# Patient Record
Sex: Female | Born: 1955 | Race: White | Hispanic: No | Marital: Single | Smoking: Former smoker
Health system: Southern US, Community
[De-identification: ages and names within clinical notes are randomized; demographics above are authoritative.]

## PROBLEM LIST (undated history)

## (undated) DIAGNOSIS — I10 Essential (primary) hypertension: Secondary | ICD-10-CM

## (undated) HISTORY — PX: TONSILLECTOMY: SUR1361

## (undated) HISTORY — PX: ABDOMINAL HYSTERECTOMY: SHX81

## (undated) HISTORY — PX: CHOLECYSTECTOMY: SHX55

## (undated) HISTORY — PX: APPENDECTOMY: SHX54

---

## 2004-04-06 ENCOUNTER — Ambulatory Visit: Payer: Self-pay | Admitting: Family Medicine

## 2005-01-18 ENCOUNTER — Other Ambulatory Visit: Payer: Self-pay

## 2005-01-31 ENCOUNTER — Ambulatory Visit: Payer: Self-pay | Admitting: Unknown Physician Specialty

## 2005-02-23 ENCOUNTER — Emergency Department: Payer: Self-pay | Admitting: Emergency Medicine

## 2005-04-11 ENCOUNTER — Ambulatory Visit: Payer: Self-pay | Admitting: Family Medicine

## 2006-05-09 ENCOUNTER — Ambulatory Visit: Payer: Self-pay | Admitting: Family Medicine

## 2007-05-10 ENCOUNTER — Ambulatory Visit: Payer: Self-pay

## 2012-08-16 ENCOUNTER — Emergency Department (HOSPITAL_COMMUNITY)
Admission: EM | Admit: 2012-08-16 | Discharge: 2012-08-16 | Disposition: A | Discharge status: Home / Self Care | Payer: Self-pay | Source: Home / Self Care

## 2012-08-16 ENCOUNTER — Encounter (HOSPITAL_COMMUNITY): Payer: Self-pay

## 2012-08-16 DIAGNOSIS — H109 Unspecified conjunctivitis: Secondary | ICD-10-CM

## 2012-08-16 DIAGNOSIS — I10 Essential (primary) hypertension: Secondary | ICD-10-CM

## 2012-08-16 HISTORY — DX: Essential (primary) hypertension: I10

## 2012-08-16 MED ORDER — LISINOPRIL-HYDROCHLOROTHIAZIDE 20-25 MG PO TABS: 1.0000 | ORAL_TABLET | Freq: Every day | ORAL | Status: DC

## 2012-08-16 MED ORDER — ERYTHROMYCIN 5 MG/GM OP OINT: TOPICAL_OINTMENT | Freq: Four times a day (QID) | OPHTHALMIC | Status: DC

## 2012-08-16 NOTE — ED Provider Notes (Signed)
Patient Demographics  Natalie Cuevas, is a 57 y.o. female  ZOX:[id number]  [id number]:[id number]  DOB - March 22, 1956  Chief Complaint  Patient presents with  . Hypertension        Subjective:   Natalie Cuevas is a 57 year old with history of hypertension here today for refill of her medications and also with complaints of left eye redness and "grittiness" in that eye. She admits to a clear discharge,denies itching. Also today has, No headache, No chest pain, No abdominal pain - No Nausea, No new weakness tingling or numbness, No Cough - SOB.   Objective:    Filed Vitals:   08/16/12 1231  BP: 120/85  Pulse: 55  Temp: 97.9 F (36.6 C)  TempSrc: Oral  SpO2: 99%     Exam  Awake Alert, Oriented X 3, No new F.N deficits, Normal affect [place].AT,PERRLA,erythema of left conjunctiva, no discharge present Supple Neck,No JVD, No cervical lymphadenopathy appriciated.  Symmetrical Chest wall movement, Good air movement bilaterally, CTAB RRR,No Gallops,Rubs or new Murmurs, No Parasternal Heave +ve B.Sounds, Abd Soft, Non tender, No organomegaly appreciated, No rebound - guarding or rigidity. No Cyanosis, Clubbing or edema, No new Rash or bruise      Data Review   CBC No results found for this basename: WBC, HGB, HCT, PLT, MCV, MCH, MCHC, RDW, NEUTRABS, LYMPHSABS, MONOABS, EOSABS, BASOSABS, BANDABS, BANDSABD,  in the last 168 hours  Chemistries   No results found for this basename: NA, K, CL, CO2, GLUCOSE, BUN, CREATININE, GFRCGP, CALCIUM, MG, AST, ALT, ALKPHOS, BILITOT,  in the last 168 hours ------------------------------------------------------------------------------------------------------------------ No results found for this basename: HGBA1C,  in the last 72 hours ------------------------------------------------------------------------------------------------------------------ No results found for this basename: CHOL, HDL, LDLCALC, TRIG, CHOLHDL, LDLDIRECT,  in the last 72  hours ------------------------------------------------------------------------------------------------------------------ No results found for this basename: TSH, T4TOTAL, FREET3, T3FREE, THYROIDAB,  in the last 72 hours ------------------------------------------------------------------------------------------------------------------ No results found for this basename: VITAMINB12, FOLATE, FERRITIN, TIBC, IRON, RETICCTPCT,  in the last 72 hours  Coagulation profile  No results found for this basename: INR, PROTIME,  in the last 168 hours     Prior to Admission medications   Medication Sig Start Date End Date Taking? Authorizing Provider  erythromycin [facility]) ophthalmic ointment Place into the left eye every 6 (six) hours. 08/16/12   Kela Millin, MD  lisinopril-hydrochlorothiazide (PRINZIDE,ZESTORETIC) 20-25 MG per tablet Take 1 tablet by mouth daily. 08/16/12   Kela Millin, MD     Assessment & Plan   Left eye conjunctivitis -more likely viral but cannot exclude bacterial etiology -we'll place on erythromycin ointment Hypertension -Good control, continue Prinzide -medications refilled -followup in 3 months     Follow-up Information   Follow up In 1 month. (or as needed)        Micajah Dennin C M.D on 08/16/2012 at 1:26 PM   Kela Millin, MD 08/16/12 [phone]

## 2012-08-16 NOTE — ED Notes (Signed)
Patient has history of HTN Needs medication refills

## 2014-12-01 ENCOUNTER — Ambulatory Visit: Payer: Self-pay | Attending: Family Medicine | Admitting: Family Medicine

## 2014-12-01 ENCOUNTER — Encounter: Payer: Self-pay | Admitting: Family Medicine

## 2014-12-01 VITALS — BP 178/122 | HR 79 | Temp 98.1°F | Resp 18 | Ht 66.0 in | Wt 249.0 lb

## 2014-12-01 DIAGNOSIS — Z720 Tobacco use: Secondary | ICD-10-CM | POA: Insufficient documentation

## 2014-12-01 DIAGNOSIS — K047 Periapical abscess without sinus: Secondary | ICD-10-CM | POA: Insufficient documentation

## 2014-12-01 DIAGNOSIS — I10 Essential (primary) hypertension: Secondary | ICD-10-CM | POA: Insufficient documentation

## 2014-12-01 HISTORY — DX: Periapical abscess without sinus: K04.7

## 2014-12-01 MED ORDER — LISINOPRIL-HYDROCHLOROTHIAZIDE 20-25 MG PO TABS: 1.0000 | ORAL_TABLET | Freq: Every day | ORAL | Status: DC

## 2014-12-01 NOTE — Patient Instructions (Signed)

## 2014-12-01 NOTE — Progress Notes (Signed)
Subjective:    Patient ID: Natalie Cuevas, female    DOB: [birth year], 59 y.o.   MRN: [id number]  HPI  Fateema Motola is a 59 year old female with a history of hypertension who has not been on antihypertensives due to the fact that she lost her insurance and has not been followed by primary care physician in years. She recently presented to the urgent care with tooth ache and was found to have a tooth abscess of the left upper canine tooth and was also found to have an elevated blood pressure with systolic in the 170s. She was placed on amoxicillin which she is currently taking and is planning to attend the free dental clinic for tooth extraction.   Past Medical History  Diagnosis Date  . Hypertension     Past Surgical History  Procedure Laterality Date  . Cholecystectomy    . Appendectomy    . Tonsillectomy     History   Social History  . Marital Status: Single    Spouse Name: N/A  . Number of Children: N/A  . Years of Education: N/A   Occupational History  . Not on file.   Social History Main Topics  . Smoking status: Current Every Day Smoker -- 0.25 packs/day  . Smokeless tobacco: Not on file  . Alcohol Use: No  . Drug Use: No  . Sexual Activity: Not on file   Other Topics Concern  . Not on file   Social History Narrative    Family History  Problem Relation Age of Onset  . Hypertension Mother     Allergies  Allergen Reactions  . Codeine      Review of Systems  General: negative for fever, weight loss, appetite change Eyes: no visual symptoms. ENT: no ear symptoms, no sinus tenderness, no nasal congestion or sore throat, tooth ache Neck: no pain  Respiratory: no wheezing, shortness of breath, cough Cardiovascular: no chest pain, no dyspnea on exertion, no pedal edema, no orthopnea. Gastrointestinal: no abdominal pain, no diarrhea, no constipation Genito-Urinary: no urinary frequency, no dysuria, no polyuria. Hematologic: no bruising Endocrine:  no cold or heat intolerance Neurological: no headaches, no seizures, no tremors Musculoskeletal: no joint pains, no joint swelling Skin: no pruritus, no rash. Psychological: no depression, no anxiety,       Objective: Filed Vitals:   12/01/14 0914 12/01/14 0938  BP: 176/114 178/122  Pulse: 79   Temp: 98.1 F (36.7 C)   TempSrc: Oral   Resp: 18   Height: 5\' 6"  (1.676 m)   Weight: 249 lb (112.946 kg)   SpO2: 95%       Physical Exam  Constitutional: obese,  Eyes: PERRLA HEENT: Head is atraumatic, normal sinuses, poor oral dentition with loss of multiple teeth, normal appearing tonsils and palate, tympanic membrane is normal bilaterally. Neck: normal range of motion, no thyromegaly, no JVD Cardiovascular: normal rate and rhythm, normal heart sounds, no murmurs, rub or gallop, no pedal edema Respiratory: clear to auscultation bilaterally, no wheezes, no rales, no rhonchi Abdomen: soft, not tender to palpation, normal bowel sounds, no enlarged organs Extremities: Full ROM, no tenderness in joints Skin: warm and dry, no lesions. Neurological: alert, oriented x3, cranial nerves I-XII grossly intact , normal motor strength, normal sensation. Psychological: normal mood.        Assessment & Plan:  59 year old female with a history of hypertension not on any antihypertensives and currently on amoxicillin for tooth abscess who is here to establish care.  Uncontrolled hypertension: Commenced on lisinopril/HCTZ- side effects discussed. Will recheck blood pressure next office visit meanwhile lifestyle modifications advised including low-sodium diet, increasing physical activity, reducing portion sizes. Fasting labs at next office visit.  Tooth abscess: Currently on antibiotics.  Tobacco abuse: Smoking cessation support: smoking cessation hotline: 1-800-QUIT-NOW.  Smoking cessation classes are available through [facility]. Call [phone] or visit our  website at [email or web address].  Spent 3 minutes counseling on smoking cessation and patient is not ready to quit.   This note has been created with Education officer, environmental. Any transcriptional errors are unintentional.

## 2014-12-01 NOTE — Progress Notes (Signed)
Patient here for ED follow up for tooth abscess and HTN.  Patient states she feels "alright" today.  Patient has no complaints of pain at this time.  Patient needs BP medication.  BP 176/114, HR 79.  Patient not ready to quit smoking. Patient taking Amoxicillin for tooth abscess, will finish taking this week.

## 2014-12-19 ENCOUNTER — Ambulatory Visit: Payer: Self-pay | Attending: Family Medicine | Admitting: Family Medicine

## 2014-12-19 ENCOUNTER — Encounter: Payer: Self-pay | Admitting: Family Medicine

## 2014-12-19 VITALS — BP 123/90 | HR 98 | Temp 98.3°F | Resp 18 | Ht 66.0 in | Wt 243.8 lb

## 2014-12-19 DIAGNOSIS — Z79899 Other long term (current) drug therapy: Secondary | ICD-10-CM | POA: Insufficient documentation

## 2014-12-19 DIAGNOSIS — E669 Obesity, unspecified: Secondary | ICD-10-CM | POA: Insufficient documentation

## 2014-12-19 DIAGNOSIS — I10 Essential (primary) hypertension: Secondary | ICD-10-CM | POA: Insufficient documentation

## 2014-12-19 DIAGNOSIS — F17210 Nicotine dependence, cigarettes, uncomplicated: Secondary | ICD-10-CM | POA: Insufficient documentation

## 2014-12-19 LAB — BASIC METABOLIC PANEL
BUN: 18 mg/dL (ref 6–23)
CHLORIDE: 98 meq/L (ref 96–112)
CO2: 30 mEq/L (ref 19–32)
Calcium: 9.5 mg/dL (ref 8.4–10.5)
Creat: 0.86 mg/dL (ref 0.50–1.10)
GLUCOSE: 96 mg/dL (ref 70–99)
POTASSIUM: 4.3 meq/L (ref 3.5–5.3)
SODIUM: 140 meq/L (ref 135–145)

## 2014-12-19 NOTE — Progress Notes (Signed)
Subjective:    Patient ID: Natalie Cuevas, female    DOB: 26-Apr-1956, 59 y.o.   MRN: [id number]  HPI  Britt BottomCarolyn Cuevas is a 59 year old female with a history of newly diagnosed hypertension who was commenced on lisinopril/HCTZ at her last office visit and comes in today for follow-up of her blood pressure.  She endorses compliance with her medication and has no complaints today. Denies chest pains, shortness of breath, pedal edema.   Past Medical History  Diagnosis Date  . Hypertension     Past Surgical History  Procedure Laterality Date  . Cholecystectomy    . Appendectomy    . Tonsillectomy      Family History  Problem Relation Age of Onset  . Hypertension Mother     History   Social History  . Marital Status: Single    Spouse Name: N/A  . Number of Children: N/A  . Years of Education: N/A   Occupational History  . Not on file.   Social History Main Topics  . Smoking status: Current Every Day Smoker -- 0.25 packs/day  . Smokeless tobacco: Not on file  . Alcohol Use: No  . Drug Use: No  . Sexual Activity: Not on file   Other Topics Concern  . Not on file   Social History Narrative    Allergies  Allergen Reactions  . Codeine     Current Outpatient Prescriptions on File Prior to Visit  Medication Sig Dispense Refill  . lisinopril-hydrochlorothiazide (PRINZIDE,ZESTORETIC) 20-25 MG per tablet Take 1 tablet by mouth daily. 30 tablet 2  . erythromycin (ROMYCIN) ophthalmic ointment Place into the left eye every 6 (six) hours. (Patient not taking: Reported on 12/01/2014) 3.5 g 0   No current facility-administered medications on file prior to visit.     Review of Systems  General: negative for fever, weight loss, appetite change Eyes: no visual symptoms. ENT: no ear symptoms, no sinus tenderness, no nasal congestion or sore throat. Neck: no pain  Respiratory: no wheezing, shortness of breath, cough Cardiovascular: no chest pain, no dyspnea on  exertion, no pedal edema, no orthopnea. Gastrointestinal: no abdominal pain, no diarrhea, no constipation Genito-Urinary: no urinary frequency, no dysuria, no polyuria. Hematologic: no bruising Endocrine: no cold or heat intolerance Neurological: no headaches, no seizures, no tremors Musculoskeletal: no joint pains, no joint swelling Skin: no pruritus, no rash. Psychological: no depression, no anxiety,       Objective: Filed Vitals:   12/19/14 1532  BP: 123/90  Pulse: 98  Temp: 98.3 F (36.8 C)  TempSrc: Oral  Resp: 18  Height: 5\' 6"  (1.676 m)  Weight: 243 lb 12.8 oz (110.587 kg)  SpO2: 95%      Physical Exam  Constitutional: obese Neck: normal range of motion, no thyromegaly, no JVD Cardiovascular: normal rate and rhythm, normal heart sounds, no murmurs, rub or gallop, no pedal edema Respiratory: clear to auscultation bilaterally, no wheezes, no rales, no rhonchi Abdomen: soft, not tender to palpation, normal bowel sounds, no enlarged organs Extremities: Full ROM, no tenderness in joints Skin: warm and dry, no lesions. Neurological: alert, oriented x3, cranial nerves I-XII grossly intact , normal motor strength, normal sensation. Psychological: normal mood.         Assessment & Plan:   59 year old obese, female patient with hypertension currently controlled on lisinopril/HCTZ who is in today for follow-up visit.  Hypertension: Controlled. Continue lisinopril/HCTZ, low-sodium, DASH each diet. I will send off a basic metabolic panel to check renal  function.  Obesity: Advised on active lifestyle management Reduce portion sizes, increase physical activity.  This note has been created with Education officer, environmental. Any transcriptional errors are unintentional.

## 2014-12-19 NOTE — Progress Notes (Signed)
Patient here for follow up for HTN. Patient reports she feels fine today. Patient denies pain today but reports soreness of upper lip area when touched due to an abscessed tooth. Patient has taken her BP medication today.  BP 123/90

## 2014-12-19 NOTE — Patient Instructions (Signed)

## 2014-12-23 ENCOUNTER — Telehealth: Payer: Self-pay | Admitting: *Deleted

## 2014-12-23 NOTE — Telephone Encounter (Signed)
-----   Message from Jaclyn ShaggyEnobong Amao, MD sent at 12/20/2014  8:14 AM EDT ----- Please inform the patient that labs are normal. Thank you.

## 2014-12-23 NOTE — Telephone Encounter (Signed)
Verified patient's name and date of birth and gave her normal lab results.  Patient had a question regarding her orange card application [facility]

## 2014-12-26 ENCOUNTER — Ambulatory Visit: Payer: Self-pay | Attending: Internal Medicine

## 2014-12-26 ENCOUNTER — Ambulatory Visit: Payer: Self-pay

## 2015-01-15 ENCOUNTER — Encounter: Payer: Self-pay | Admitting: Internal Medicine

## 2015-01-15 ENCOUNTER — Ambulatory Visit: Payer: Self-pay | Attending: Internal Medicine | Admitting: Internal Medicine

## 2015-01-15 VITALS — BP 121/83 | HR 84 | Temp 98.0°F | Resp 18 | Ht 66.0 in | Wt 242.0 lb

## 2015-01-15 DIAGNOSIS — Z720 Tobacco use: Secondary | ICD-10-CM

## 2015-01-15 DIAGNOSIS — F17200 Nicotine dependence, unspecified, uncomplicated: Secondary | ICD-10-CM | POA: Insufficient documentation

## 2015-01-15 DIAGNOSIS — F17210 Nicotine dependence, cigarettes, uncomplicated: Secondary | ICD-10-CM | POA: Insufficient documentation

## 2015-01-15 DIAGNOSIS — Z79899 Other long term (current) drug therapy: Secondary | ICD-10-CM | POA: Insufficient documentation

## 2015-01-15 DIAGNOSIS — I10 Essential (primary) hypertension: Secondary | ICD-10-CM

## 2015-01-15 DIAGNOSIS — E669 Obesity, unspecified: Secondary | ICD-10-CM

## 2015-01-15 LAB — CBC
HEMATOCRIT: 43.8 % (ref 36.0–46.0)
HEMOGLOBIN: 14.6 g/dL (ref 12.0–15.0)
MCH: 28.6 pg (ref 26.0–34.0)
MCHC: 33.3 g/dL (ref 30.0–36.0)
MCV: 85.9 fL (ref 78.0–100.0)
MPV: 10.1 fL (ref 8.6–12.4)
PLATELETS: 284 10*3/uL (ref 150–400)
RBC: 5.1 MIL/uL (ref 3.87–5.11)
RDW: 14.4 % (ref 11.5–15.5)
WBC: 9.4 10*3/uL (ref 4.0–10.5)

## 2015-01-15 MED ORDER — LISINOPRIL-HYDROCHLOROTHIAZIDE 20-25 MG PO TABS: 1.0000 | ORAL_TABLET | Freq: Every day | ORAL | Status: DC

## 2015-01-15 NOTE — Patient Instructions (Signed)
Exercise to Lose Weight Exercise and a healthy diet may help you lose weight. Your doctor may suggest specific exercises. EXERCISE IDEAS AND TIPS  Choose low-cost things you enjoy doing, such as walking, bicycling, or exercising to workout videos.  Take stairs instead of the elevator.  Walk during your lunch break.  Park your car further away from work or school.  Go to a gym or an exercise class.  Start with 5 to 10 minutes of exercise each day. Build up to 30 minutes of exercise 4 to 6 days a week.  Wear shoes with good support and comfortable clothes.  Stretch before and after working out.  Work out until you breathe harder and your heart beats faster.  Drink extra water when you exercise.  Do not do so much that you hurt yourself, feel dizzy, or get very short of breath. Exercises that burn about 150 calories:  Running 1  miles in 15 minutes.  Playing volleyball for 45 to 60 minutes.  Washing and waxing a car for 45 to 60 minutes.  Playing touch football for 45 minutes.  Walking 1  miles in 35 minutes.  Pushing a stroller 1  miles in 30 minutes.  Playing basketball for 30 minutes.  Raking leaves for 30 minutes.  Bicycling 5 miles in 30 minutes.  Walking 2 miles in 30 minutes.  Dancing for 30 minutes.  Shoveling snow for 15 minutes.  Swimming laps for 20 minutes.  Walking up stairs for 15 minutes.  Bicycling 4 miles in 15 minutes.  Gardening for 30 to 45 minutes.  Jumping rope for 15 minutes.  Washing windows or floors for 45 to 60 minutes. Document Released: 07/09/2010 Document Revised: 08/29/2011 Document Reviewed: 07/09/2010 ExitCare Patient Information 2015 [employer]. This information is not intended to replace advice given to you by your health care provider. Make sure you discuss any questions you have with your health care provider.   Smoking Cessation Quitting smoking is important to your health and has many advantages. However,  it is not always easy to quit since nicotine is a very addictive drug. Oftentimes, people try 3 times or more before being able to quit. This document explains the best ways for you to prepare to quit smoking. Quitting takes hard work and a lot of effort, but you can do it. ADVANTAGES OF QUITTING SMOKING  You will live longer, feel better, and live better.  Your body will feel the impact of quitting smoking almost immediately.  Within 20 minutes, blood pressure decreases. Your pulse returns to its normal level.  After 8 hours, carbon monoxide levels in the blood return to normal. Your oxygen level increases.  After 24 hours, the chance of having a heart attack starts to decrease. Your breath, hair, and body stop smelling like smoke.  After 48 hours, damaged nerve endings begin to recover. Your sense of taste and smell improve.  After 72 hours, the body is virtually free of nicotine. Your bronchial tubes relax and breathing becomes easier.  After 2 to 12 weeks, lungs can hold more air. Exercise becomes easier and circulation improves.  The risk of having a heart attack, stroke, cancer, or lung disease is greatly reduced.  After 1 year, the risk of coronary heart disease is cut in half.  After 5 years, the risk of stroke falls to the same as a nonsmoker.  After 10 years, the risk of lung cancer is cut in half and the risk of other cancers decreases significantly.    After 15 years, the risk of coronary heart disease drops, usually to the level of a nonsmoker.  If you are pregnant, quitting smoking will improve your chances of having a healthy baby.  The people you live with, especially any children, will be healthier.  You will have extra money to spend on things other than cigarettes. QUESTIONS TO THINK ABOUT BEFORE ATTEMPTING TO QUIT You may want to talk about your answers with your health care provider.  Why do you want to quit?  If you tried to quit in the past, what helped and  what did not?  What will be the most difficult situations for you after you quit? How will you plan to handle them?  Who can help you through the tough times? Your family? Friends? A health care provider?  What pleasures do you get from smoking? What ways can you still get pleasure if you quit? Here are some questions to ask your health care provider:  How can you help me to be successful at quitting?  What medicine do you think would be best for me and how should I take it?  What should I do if I need more help?  What is smoking withdrawal like? How can I get information on withdrawal? GET READY  Set a quit date.  Change your environment by getting rid of all cigarettes, ashtrays, matches, and lighters in your home, car, or work. Do not let people smoke in your home.  Review your past attempts to quit. Think about what worked and what did not. GET SUPPORT AND ENCOURAGEMENT You have a better chance of being successful if you have help. You can get support in many ways.  Tell your family, friends, and coworkers that you are going to quit and need their support. Ask them not to smoke around you.  Get individual, group, or telephone counseling and support. Programs are available at local hospitals and health centers. Call your local health department for information about programs in your area.  Spiritual beliefs and practices may help some smokers quit.  Download a "quit meter" on your computer to keep track of quit statistics, such as how long you have gone without smoking, cigarettes not smoked, and money saved.  Get a self-help book about quitting smoking and staying off tobacco. LEARN NEW SKILLS AND BEHAVIORS  Distract yourself from urges to smoke. Talk to someone, go for a walk, or occupy your time with a task.  Change your normal routine. Take a different route to work. Drink tea instead of coffee. Eat breakfast in a different place.  Reduce your stress. Take a hot bath,  exercise, or read a book.  Plan something enjoyable to do every day. Reward yourself for not smoking.  Explore interactive web-based programs that specialize in helping you quit. GET MEDICINE AND USE IT CORRECTLY Medicines can help you stop smoking and decrease the urge to smoke. Combining medicine with the above behavioral methods and support can greatly increase your chances of successfully quitting smoking.  Nicotine replacement therapy helps deliver nicotine to your body without the negative effects and risks of smoking. Nicotine replacement therapy includes nicotine gum, lozenges, inhalers, nasal sprays, and skin patches. Some may be available over-the-counter and others require a prescription.  Antidepressant medicine helps people abstain from smoking, but how this works is unknown. This medicine is available by prescription.  Nicotinic receptor partial agonist medicine simulates the effect of nicotine in your brain. This medicine is available by prescription. Ask your   health care provider for advice about which medicines to use and how to use them based on your health history. Your health care provider will tell you what side effects to look out for if you choose to be on a medicine or therapy. Carefully read the information on the package. Do not use any other product containing nicotine while using a nicotine replacement product.  RELAPSE OR DIFFICULT SITUATIONS Most relapses occur within the first 3 months after quitting. Do not be discouraged if you start smoking again. Remember, most people try several times before finally quitting. You may have symptoms of withdrawal because your body is used to nicotine. You may crave cigarettes, be irritable, feel very hungry, cough often, get headaches, or have difficulty concentrating. The withdrawal symptoms are only temporary. They are strongest when you first quit, but they will go away within 10-14 days. To reduce the chances of relapse, try  to:  Avoid drinking alcohol. Drinking lowers your chances of successfully quitting.  Reduce the amount of caffeine you consume. Once you quit smoking, the amount of caffeine in your body increases and can give you symptoms, such as a rapid heartbeat, sweating, and anxiety.  Avoid smokers because they can make you want to smoke.  Do not let weight gain distract you. Many smokers will gain weight when they quit, usually less than 10 pounds. Eat a healthy diet and stay active. You can always lose the weight gained after you quit.  Find ways to improve your mood other than smoking. FOR MORE INFORMATION  www.smokefree.gov  Document Released: 05/31/2001 Document Revised: 10/21/2013 Document Reviewed: 09/15/2011 ExitCare Patient Information 2015 [employer]. This information is not intended to replace advice given to you by your health care provider. Make sure you discuss any questions you have with your health care provider.  

## 2015-01-15 NOTE — Progress Notes (Signed)
Patient here to establish care for HTN and for fasting labs. Patient is fasting.   Last had BP med this morning.

## 2015-01-15 NOTE — Progress Notes (Signed)
Patient ID: Natalie Cuevas, female   DOB: 10-Aug-1955, 59 y.o.   MRN: [id number]  CC: HTN  HPI: Natalie Cuevas is a 59 y.o. female here today for a follow up visit.  Patient has past medical history of hypertension and tobacco use. She smokes 4 cigarettes per day and is not ready to quit smoking. She has been on Lisinopril-HCTZ for 2 months with no complications. She denies headache, chest pain, SOB, blurred vision, dizziness, edema. Patient is not up to date on pap or mammogram and is currently refusing to have one.    Allergies  Allergen Reactions  . Codeine    Past Medical History  Diagnosis Date  . Hypertension    Current Outpatient Prescriptions on File Prior to Visit  Medication Sig Dispense Refill  . lisinopril-hydrochlorothiazide (PRINZIDE,ZESTORETIC) 20-25 MG per tablet Take 1 tablet by mouth daily. 30 tablet 2  . erythromycin (ROMYCIN) ophthalmic ointment Place into the left eye every 6 (six) hours. (Patient not taking: Reported on 12/01/2014) 3.5 g 0   No current facility-administered medications on file prior to visit.   Family History  Problem Relation Age of Onset  . Hypertension Mother    History   Social History  . Marital Status: Single    Spouse Name: N/A  . Number of Children: N/A  . Years of Education: N/A   Occupational History  . Not on file.   Social History Main Topics  . Smoking status: Current Every Day Smoker -- 0.25 packs/day  . Smokeless tobacco: Not on file  . Alcohol Use: No  . Drug Use: No  . Sexual Activity: Not on file   Other Topics Concern  . Not on file   Social History Narrative    Review of Systems: See HPI   Objective:   Filed Vitals:   01/15/15 0911  BP: 121/83  Pulse: 84  Temp: 98 F (36.7 C)  Resp: 18    Physical Exam  Constitutional: She is oriented to person, place, and time.  Cardiovascular: Normal rate, regular rhythm and normal heart sounds.   Pulmonary/Chest: Effort normal and breath sounds normal.   Musculoskeletal: She exhibits no edema.  Neurological: She is alert and oriented to person, place, and time.  Psychiatric: She has a normal mood and affect.     No results found for: WBC, HGB, HCT, MCV, PLT Lab Results  Component Value Date   CREATININE 0.86 12/19/2014   BUN 18 12/19/2014   NA 140 12/19/2014   K 4.3 12/19/2014   CL 98 12/19/2014   CO2 30 12/19/2014    No results found for: HGBA1C Lipid Panel  No results found for: CHOL, TRIG, HDL, CHOLHDL, VLDL, LDLCALC     Assessment and plan:   [name] was seen today for establish care.  Diagnoses and all orders for this visit:  Essential hypertension Orders: -     Lipid panel -     CBC -     TSH -     Hemoglobin A1C -     Vitamin D, 25-hydroxy -     lisinopril-hydrochlorothiazide (PRINZIDE,ZESTORETIC) 20-25 MG per tablet; Take 1 tablet by mouth daily. Patient blood pressure is stable and may continue on current medication.  Education on diet, exercise, and modifiable risk factors discussed. Will obtain appropriate labs as needed. Will follow up in 3-6 months.   Obesity Weight loss discussed at length and its complications to health.  Patient will loss 10 months by next visit in 3 months.  Diet and exercise discussed as well as calorie intake.  Tobacco use disorder Smoking cessation discussed for 3 minutes, patient is not willing to quit at this time. Will continue to assess on each visit. Discussed increased risk for diseases such as cancer, heart disease, and stroke.   Return in about 3 months (around 04/17/2015) for Hypertension.       Ambrose Finland, NP-C [facility] [phone] 01/15/2015, 9:31 AM

## 2015-01-16 ENCOUNTER — Ambulatory Visit: Payer: Self-pay | Admitting: Internal Medicine

## 2015-01-16 LAB — HEMOGLOBIN A1C
Hgb A1c MFr Bld: 6.3 % — ABNORMAL HIGH (ref <5.7)
MEAN PLASMA GLUCOSE: 134 mg/dL — AB (ref <117)

## 2015-01-16 LAB — TSH: TSH: 3.092 u[IU]/mL (ref 0.350–4.500)

## 2015-01-16 LAB — LIPID PANEL
Cholesterol: 170 mg/dL (ref 125–200)
HDL: 39 mg/dL — ABNORMAL LOW (ref >=46)
LDL CALC: 104 mg/dL (ref <130)
TRIGLYCERIDES: 133 mg/dL (ref <150)
Total CHOL/HDL Ratio: 4.4 Ratio (ref <=5.0)
VLDL: 27 mg/dL (ref <30)

## 2015-01-16 LAB — VITAMIN D 25 HYDROXY (VIT D DEFICIENCY, FRACTURES): Vit D, 25-Hydroxy: 24 ng/mL — ABNORMAL LOW (ref 30–100)

## 2015-01-19 ENCOUNTER — Telehealth: Payer: Self-pay

## 2015-01-19 NOTE — Telephone Encounter (Signed)
-----   Message from Ambrose Finland, NP sent at 01/18/2015  8:57 PM EDT ----- Prediabetic. Please educate. Vitamin D is low, please have patient to get OTC vitamin D 800 IU to take daily. All other labs are normal

## 2015-01-19 NOTE — Telephone Encounter (Signed)
Nurse called patient, patient verified date of birth. Patient aware of being prediabetic. Nurse educated patient on limiting carbohydrates and sugar. Nurse explained to patient that potatoes, rice, pasta and bread are carbohydrates and carbohydrates turn to sugar and will increase glucose levels. Patient aware of importance of adding exercise, as tolerated, at least 3 times per week, 30 minutes each time.  Patient aware of low Vit D and agrees to get OTC vit D 800 IU to take daily.  Patient aware of all other labs are normal. Patient voices understanding and has no further questions at this time.

## 2015-04-07 ENCOUNTER — Ambulatory Visit: Payer: Self-pay | Admitting: Internal Medicine

## 2015-06-26 MED FILL — LISINOPRIL-HCTZ 20-25 MG TA: 20-25 | 30 days supply | Qty: 30 | Fill #4

## 2015-07-29 MED FILL — LISINOPRIL-HCTZ 20-25 MG TA: 20-25 | 30 days supply | Qty: 30 | Fill #5

## 2015-08-25 ENCOUNTER — Other Ambulatory Visit: Payer: Self-pay | Admitting: Internal Medicine

## 2015-08-25 MED FILL — LISINOPRIL-HCTZ 20-25 MG TA: 20-25 | 30 days supply | Qty: 30 | Fill #0

## 2015-09-07 ENCOUNTER — Ambulatory Visit: Payer: Self-pay | Admitting: Internal Medicine

## 2015-09-08 ENCOUNTER — Ambulatory Visit: Payer: Self-pay | Attending: Internal Medicine | Admitting: Pharmacist

## 2015-09-08 ENCOUNTER — Other Ambulatory Visit: Payer: Self-pay | Admitting: Internal Medicine

## 2015-09-08 ENCOUNTER — Encounter: Payer: Self-pay | Admitting: Pharmacist

## 2015-09-08 VITALS — BP 107/75 | HR 66

## 2015-09-08 DIAGNOSIS — I10 Essential (primary) hypertension: Secondary | ICD-10-CM | POA: Insufficient documentation

## 2015-09-08 DIAGNOSIS — Z79899 Other long term (current) drug therapy: Secondary | ICD-10-CM | POA: Insufficient documentation

## 2015-09-08 MED ORDER — LISINOPRIL-HYDROCHLOROTHIAZIDE 20-25 MG PO TABS: 1.0000 | ORAL_TABLET | Freq: Every day | ORAL | Status: DC

## 2015-09-08 NOTE — Progress Notes (Signed)
S:    Patient arrives in good spirits. Presents to the clinic for hypertension evaluation. She was supposed to see Holland CommonsValerie Keck, NP, yesterday and her appointment was cancelled.   Patient reports adherence with medication. She needs a refill on her medication.  Current BP Medications include:  Lisinopril-HCTZ 20-25 mg daily   O:   Last 3 Office BP readings: BP Readings from Last 3 Encounters:  09/08/15 107/75  01/15/15 121/83  12/19/14 123/90    BMET    Component Value Date/Time   NA 140 12/19/2014 1553   K 4.3 12/19/2014 1553   CL 98 12/19/2014 1553   CO2 30 12/19/2014 1553   GLUCOSE 96 12/19/2014 1553   BUN 18 12/19/2014 1553   CREATININE 0.86 12/19/2014 1553   CALCIUM 9.5 12/19/2014 1553    A/P: Hypertension longstanding currently controlled on current medications.  Continued lisinopril-HCTZ 20-25 mg. Patient needs follow up with Holland CommonsValerie Keck for a routine examination.  Results reviewed and written information provided.   Total time in face-to-face counseling 10 minutes.   F/U Clinic Visit with Holland CommonsValerie Keck.  Patient seen with Theresa MulliganMartin Shaughnessy, PharmD Candidate

## 2015-09-08 NOTE — Patient Instructions (Addendum)
Thanks for coming to see me today!  Blood pressure looks great!  Schedule an appointment to see Holland CommonsValerie Keck for a routine follow up

## 2015-09-29 MED FILL — LISINOPRIL-HCTZ 20-25 MG TA: 20-25 | 30 days supply | Qty: 30 | Fill #0

## 2015-11-04 MED FILL — LISINOPRIL-HCTZ 20-25 MG TA: 20-25 | 30 days supply | Qty: 30 | Fill #1

## 2015-11-30 ENCOUNTER — Ambulatory Visit: Payer: Self-pay | Attending: Family Medicine | Admitting: Family Medicine

## 2015-11-30 ENCOUNTER — Encounter: Payer: Self-pay | Admitting: Family Medicine

## 2015-11-30 VITALS — BP 112/81 | HR 93 | Temp 97.5°F | Ht 66.0 in | Wt 235.4 lb

## 2015-11-30 DIAGNOSIS — Z1159 Encounter for screening for other viral diseases: Secondary | ICD-10-CM

## 2015-11-30 DIAGNOSIS — E1165 Type 2 diabetes mellitus with hyperglycemia: Secondary | ICD-10-CM | POA: Insufficient documentation

## 2015-11-30 DIAGNOSIS — R7303 Prediabetes: Secondary | ICD-10-CM

## 2015-11-30 DIAGNOSIS — I10 Essential (primary) hypertension: Secondary | ICD-10-CM

## 2015-11-30 LAB — POCT GLYCOSYLATED HEMOGLOBIN (HGB A1C): Hemoglobin A1C: 5.7

## 2015-11-30 MED ORDER — LISINOPRIL-HYDROCHLOROTHIAZIDE 20-25 MG PO TABS: 1.0000 | ORAL_TABLET | Freq: Every day | ORAL | Status: DC

## 2015-11-30 MED FILL — LISINOPRIL-HCTZ 20-25 MG TA: 20-25 | 30 days supply | Qty: 30 | Fill #0

## 2015-11-30 NOTE — Progress Notes (Signed)
   Subjective:  Patient ID: Natalie Cuevas, female    DOB: 01-04-1956  Age: 60 y.o. MRN: [id number]  CC: HTN   HPI Natalie Cuevas is a 60 year old female with a history of hypertension, prediabetes who comes into the clinic to establish care with me. She was previously followed by the nurse practitioner who is no longer with the practice.  Has been compliant with her antihypertensive but not with low-sodium diet and she relies on food stamps; she has also not been compliant with an exercise regimen. She quit smoking a few months ago.  No complaints at this time.    Outpatient Prescriptions Prior to Visit  Medication Sig Dispense Refill  . lisinopril-hydrochlorothiazide (PRINZIDE,ZESTORETIC) 20-25 MG tablet Take 1 tablet by mouth daily. 30 tablet 2   No facility-administered medications prior to visit.    ROS Review of Systems  Constitutional: Negative for activity change, appetite change and fatigue.  HENT: Negative for congestion, sinus pressure and sore throat.   Eyes: Negative for visual disturbance.  Respiratory: Negative for cough, chest tightness, shortness of breath and wheezing.   Cardiovascular: Negative for chest pain and palpitations.  Gastrointestinal: Negative for abdominal pain, constipation and abdominal distention.  Endocrine: Negative for polydipsia.  Genitourinary: Negative for dysuria and frequency.  Musculoskeletal: Negative for back pain and arthralgias.  Skin: Negative for rash.  Neurological: Negative for tremors, light-headedness and numbness.  Hematological: Does not bruise/bleed easily.  Psychiatric/Behavioral: Negative for behavioral problems and agitation.    Objective:  BP 112/81 mmHg  Pulse 93  Temp(Src) 97.5 F (36.4 C)  Ht 5\' 6"  (1.676 m)  Wt 235 lb 6.4 oz (106.777 kg)  BMI 38.01 kg/m2  BP/Weight 11/30/2015 09/08/2015 01/15/2015  Systolic BP 112 107 121  Diastolic BP 81 75 83  Wt. (Lbs) 235.4 - 242  BMI 38.01 - 39.08       Physical Exam  Constitutional: She is oriented to person, place, and time. She appears well-developed and well-nourished.  Cardiovascular: Normal rate, normal heart sounds and intact distal pulses.   No murmur heard. Pulmonary/Chest: Effort normal and breath sounds normal. She has no wheezes. She has no rales. She exhibits no tenderness.  Abdominal: Soft. Bowel sounds are normal. She exhibits no distension and no mass. There is no tenderness.  Musculoskeletal: Normal range of motion.  Neurological: She is alert and oriented to person, place, and time.    Lab Results  Component Value Date   HGBA1C 5.7 11/30/2015    Assessment & Plan:   1. Essential hypertension Controlled - lisinopril-hydrochlorothiazide (PRINZIDE,ZESTORETIC) 20-25 MG tablet; Take 1 tablet by mouth daily.  Dispense: 30 tablet; Refill: 5 - COMPLETE METABOLIC PANEL WITH GFR; Future - Lipid panel; Future  2. Prediabetes A1c 5.7 - HgB A1c  3. Need for hepatitis C screening test - Hepatitis C Antibody; Future   Meds ordered this encounter  Medications  . lisinopril-hydrochlorothiazide (PRINZIDE,ZESTORETIC) 20-25 MG tablet    Sig: Take 1 tablet by mouth daily.    Dispense:  30 tablet    Refill:  5    Follow-up: Return in about 3 weeks (around 12/21/2015) for Complete physical exam.   Jaclyn ShaggyEnobong Amao MD

## 2015-11-30 NOTE — Patient Instructions (Signed)
Hypertension Hypertension, commonly called high blood pressure, is when the force of blood pumping through your arteries is too strong. Your arteries are the blood vessels that carry blood from your heart throughout your body. A blood pressure reading consists of a higher number over a lower number, such as 110/72. The higher number (systolic) is the pressure inside your arteries when your heart pumps. The lower number (diastolic) is the pressure inside your arteries when your heart relaxes. Ideally you want your blood pressure below 120/80. Hypertension forces your heart to work harder to pump blood. Your arteries may become narrow or stiff. Having untreated or uncontrolled hypertension can cause heart attack, stroke, kidney disease, and other problems. RISK FACTORS Some risk factors for high blood pressure are controllable. Others are not.  Risk factors you cannot control include:   Race. You may be at higher risk if you are African American.  Age. Risk increases with age.  Gender. Men are at higher risk than women before age 45 years. After age 65, women are at higher risk than men. Risk factors you can control include:  Not getting enough exercise or physical activity.  Being overweight.  Getting too much fat, sugar, calories, or salt in your diet.  Drinking too much alcohol. SIGNS AND SYMPTOMS Hypertension does not usually cause signs or symptoms. Extremely high blood pressure (hypertensive crisis) may cause headache, anxiety, shortness of breath, and nosebleed. DIAGNOSIS To check if you have hypertension, your health care provider will measure your blood pressure while you are seated, with your arm held at the level of your heart. It should be measured at least twice using the same arm. Certain conditions can cause a difference in blood pressure between your right and left arms. A blood pressure reading that is higher than normal on one occasion does not mean that you need treatment. If  it is not clear whether you have high blood pressure, you may be asked to return on a different day to have your blood pressure checked again. Or, you may be asked to monitor your blood pressure at home for 1 or more weeks. TREATMENT Treating high blood pressure includes making lifestyle changes and possibly taking medicine. Living a healthy lifestyle can help lower high blood pressure. You may need to change some of your habits. Lifestyle changes may include:  Following the DASH diet. This diet is high in fruits, vegetables, and whole grains. It is low in salt, red meat, and added sugars.  Keep your sodium intake below 2,300 mg per day.  Getting at least 30-45 minutes of aerobic exercise at least 4 times per week.  Losing weight if necessary.  Not smoking.  Limiting alcoholic beverages.  Learning ways to reduce stress. Your health care provider may prescribe medicine if lifestyle changes are not enough to get your blood pressure under control, and if one of the following is true:  You are 18-59 years of age and your systolic blood pressure is above 140.  You are 60 years of age or older, and your systolic blood pressure is above 150.  Your diastolic blood pressure is above 90.  You have diabetes, and your systolic blood pressure is over 140 or your diastolic blood pressure is over 90.  You have kidney disease and your blood pressure is above 140/90.  You have heart disease and your blood pressure is above 140/90. Your personal target blood pressure may vary depending on your medical conditions, your age, and other factors. HOME CARE INSTRUCTIONS    Have your blood pressure rechecked as directed by your health care provider.   Take medicines only as directed by your health care provider. Follow the directions carefully. Blood pressure medicines must be taken as prescribed. The medicine does not work as well when you skip doses. Skipping doses also puts you at risk for  problems.  Do not smoke.   Monitor your blood pressure at home as directed by your health care provider. SEEK MEDICAL CARE IF:   You think you are having a reaction to medicines taken.  You have recurrent headaches or feel dizzy.  You have swelling in your ankles.  You have trouble with your vision. SEEK IMMEDIATE MEDICAL CARE IF:  You develop a severe headache or confusion.  You have unusual weakness, numbness, or feel faint.  You have severe chest or abdominal pain.  You vomit repeatedly.  You have trouble breathing. MAKE SURE YOU:   Understand these instructions.  Will watch your condition.  Will get help right away if you are not doing well or get worse.   This information is not intended to replace advice given to you by your health care provider. Make sure you discuss any questions you have with your health care provider.   Document Released: 06/06/2005 Document Revised: 10/21/2014 Document Reviewed: 03/29/2013 Elsevier Interactive Patient Education 2016 [employer]  

## 2015-12-03 ENCOUNTER — Ambulatory Visit: Payer: Self-pay | Attending: Family Medicine

## 2015-12-03 DIAGNOSIS — Z1159 Encounter for screening for other viral diseases: Secondary | ICD-10-CM

## 2015-12-03 DIAGNOSIS — I10 Essential (primary) hypertension: Secondary | ICD-10-CM | POA: Insufficient documentation

## 2015-12-04 ENCOUNTER — Telehealth: Payer: Self-pay | Admitting: Family Medicine

## 2015-12-04 LAB — HEPATITIS C ANTIBODY: HCV Ab: NEGATIVE

## 2015-12-04 LAB — COMPLETE METABOLIC PANEL WITH GFR
ALBUMIN: 4 g/dL (ref 3.6–5.1)
ALT: 18 U/L (ref 6–29)
AST: 19 U/L (ref 10–35)
Alkaline Phosphatase: 60 U/L (ref 33–130)
BILIRUBIN TOTAL: 0.7 mg/dL (ref 0.2–1.2)
BUN: 19 mg/dL (ref 7–25)
CALCIUM: 9.3 mg/dL (ref 8.6–10.4)
CHLORIDE: 99 mmol/L (ref 98–110)
CO2: 26 mmol/L (ref 20–31)
CREATININE: 0.88 mg/dL (ref 0.50–0.99)
GFR, Est African American: 82 mL/min (ref >=60)
GFR, Est Non African American: 71 mL/min (ref >=60)
Glucose, Bld: 89 mg/dL (ref 65–99)
Potassium: 3.5 mmol/L (ref 3.5–5.3)
Sodium: 140 mmol/L (ref 135–146)
TOTAL PROTEIN: 7.1 g/dL (ref 6.1–8.1)

## 2015-12-04 LAB — LIPID PANEL
CHOLESTEROL: 188 mg/dL (ref 125–200)
HDL: 46 mg/dL (ref >=46)
LDL CALC: 119 mg/dL (ref <130)
Total CHOL/HDL Ratio: 4.1 Ratio (ref <=5.0)
Triglycerides: 116 mg/dL (ref <150)
VLDL: 23 mg/dL (ref <30)

## 2015-12-04 NOTE — Telephone Encounter (Signed)
-----   Message from Jaclyn ShaggyEnobong Amao, MD sent at 12/04/2015  1:56 PM EDT ----- Please inform the patient that labs are normal. Thank you.

## 2015-12-04 NOTE — Telephone Encounter (Signed)
Called patient. Patient verified name and date of birth. Pt notified that her labs are normal. Pt voiced understanding.

## 2015-12-24 MED FILL — LISINOPRIL-HCTZ 20-25 MG TA: 20-25 | 30 days supply | Qty: 30 | Fill #1

## 2016-01-25 MED FILL — LISINOPRIL-HCTZ 20-25 MG TA: 20-25 | 30 days supply | Qty: 30 | Fill #2

## 2016-02-02 ENCOUNTER — Telehealth: Payer: Self-pay | Admitting: Family Medicine

## 2016-02-02 NOTE — Telephone Encounter (Signed)
Pt. Called requesting to be seen today b/c she thinks she pulled a muscle. Pt. Was told that her PCP and the other doctors did not have any appointments available for the rest of the month. Pt. Was advised to urgent care and she refused b/c she has no income. Please f/u

## 2016-02-03 NOTE — Telephone Encounter (Signed)
She can be placed on the walk-in schedule.

## 2016-02-26 ENCOUNTER — Telehealth: Payer: Self-pay | Admitting: Family Medicine

## 2016-02-26 DIAGNOSIS — I10 Essential (primary) hypertension: Secondary | ICD-10-CM

## 2016-02-26 MED ORDER — LISINOPRIL-HYDROCHLOROTHIAZIDE 20-25 MG PO TABS: 1.0000 | ORAL_TABLET | Freq: Every day | ORAL | 0 refills | Status: DC

## 2016-02-26 NOTE — Telephone Encounter (Signed)
[facility] [address] [facility]ating. Please sed lisinopril.  Please follow up

## 2016-02-26 NOTE — Telephone Encounter (Signed)
[facility]

## 2016-04-01 MED FILL — LISINOPRIL-HCTZ 20-25 MG TA: 20-25 | 30 days supply | Qty: 30 | Fill #2

## 2016-04-20 ENCOUNTER — Ambulatory Visit: Payer: Self-pay | Attending: Family Medicine | Admitting: Family Medicine

## 2016-04-20 ENCOUNTER — Encounter: Payer: Self-pay | Admitting: Family Medicine

## 2016-04-20 VITALS — BP 110/73 | HR 84 | Temp 98.0°F | Ht 65.0 in | Wt 230.0 lb

## 2016-04-20 DIAGNOSIS — I10 Essential (primary) hypertension: Secondary | ICD-10-CM

## 2016-04-20 DIAGNOSIS — Z9889 Other specified postprocedural states: Secondary | ICD-10-CM | POA: Insufficient documentation

## 2016-04-20 DIAGNOSIS — N951 Menopausal and female climacteric states: Secondary | ICD-10-CM

## 2016-04-20 DIAGNOSIS — R7303 Prediabetes: Secondary | ICD-10-CM

## 2016-04-20 DIAGNOSIS — Z90710 Acquired absence of both cervix and uterus: Secondary | ICD-10-CM | POA: Insufficient documentation

## 2016-04-20 DIAGNOSIS — Z9049 Acquired absence of other specified parts of digestive tract: Secondary | ICD-10-CM | POA: Insufficient documentation

## 2016-04-20 LAB — COMPLETE METABOLIC PANEL WITH GFR
ALBUMIN: 4.1 g/dL (ref 3.6–5.1)
ALK PHOS: 58 U/L (ref 33–130)
ALT: 21 U/L (ref 6–29)
AST: 23 U/L (ref 10–35)
BUN: 16 mg/dL (ref 7–25)
CALCIUM: 10 mg/dL (ref 8.6–10.4)
CO2: 28 mmol/L (ref 20–31)
CREATININE: 1.07 mg/dL — AB (ref 0.50–0.99)
Chloride: 100 mmol/L (ref 98–110)
GFR, EST AFRICAN AMERICAN: 65 mL/min (ref >=60)
GFR, Est Non African American: 56 mL/min — ABNORMAL LOW (ref >=60)
Glucose, Bld: 95 mg/dL (ref 65–99)
POTASSIUM: 3.8 mmol/L (ref 3.5–5.3)
Sodium: 139 mmol/L (ref 135–146)
Total Bilirubin: 0.7 mg/dL (ref 0.2–1.2)
Total Protein: 7.2 g/dL (ref 6.1–8.1)

## 2016-04-20 MED ORDER — CLONIDINE HCL 0.1 MG PO TABS: 0.1000 mg | ORAL_TABLET | Freq: Every day | ORAL | 3 refills | Status: DC

## 2016-04-20 MED ORDER — LISINOPRIL-HYDROCHLOROTHIAZIDE 20-25 MG PO TABS: 1.0000 | ORAL_TABLET | Freq: Every day | ORAL | 6 refills | Status: DC

## 2016-04-20 MED FILL — cloNIDine HCL 0.1 MG TABS: 0.1 | 30 days supply | Qty: 30 | Fill #0

## 2016-04-20 NOTE — Progress Notes (Signed)
Med refill Having hot flashes again

## 2016-04-20 NOTE — Progress Notes (Signed)
Subjective:  Patient ID: Natalie Cuevas, female    DOB: 1956/01/27  Age: 60 y.o. MRN: [id number]  CC: Hypertension   HPI Natalie Cuevas is a 60 year old female with a history of hypertension, prediabetes (A1c 5.7) who presents today for follow-up visit.  She has been compliant with her antihypertensive and low-sodium diet. Complains of hot flashes for which she was on Premarin in the past and stated that was discontinued "due to the risk for cancer". She would like to receive something else to treat this.  She has no other concerns at this time and denies chest pains, shortness of breath or pedal edema. Declines flu shot.  Past Medical History:  Diagnosis Date  . Hypertension     Past Surgical History:  Procedure Laterality Date  . APPENDECTOMY    . CHOLECYSTECTOMY    . TONSILLECTOMY      Outpatient Medications Prior to Visit  Medication Sig Dispense Refill  . lisinopril-hydrochlorothiazide (PRINZIDE,ZESTORETIC) 20-25 MG tablet Take 1 tablet by mouth daily. 30 tablet 0   No facility-administered medications prior to visit.     ROS Review of Systems  Constitutional: Negative for activity change, appetite change and fatigue.  HENT: Negative for congestion, sinus pressure and sore throat.   Eyes: Negative for visual disturbance.  Respiratory: Negative for cough, chest tightness, shortness of breath and wheezing.   Cardiovascular: Negative for chest pain and palpitations.  Gastrointestinal: Negative for abdominal distention, abdominal pain and constipation.  Endocrine: Negative for polydipsia.  Genitourinary: Negative for dysuria and frequency.  Musculoskeletal: Negative for arthralgias and back pain.  Skin: Negative for rash.  Neurological: Negative for tremors, light-headedness and numbness.  Hematological: Does not bruise/bleed easily.  Psychiatric/Behavioral: Negative for agitation and behavioral problems.    Objective:  BP 110/73 (BP Location: Right Arm,  Patient Position: Sitting, Cuff Size: Large)   Pulse 84   Temp 98 F (36.7 C) (Oral)   Ht 5\' 5"  (1.651 m)   Wt 230 lb (104.3 kg)   SpO2 97%   BMI 38.27 kg/m   BP/Weight 04/20/2016 11/30/2015 09/08/2015  Systolic BP 110 112 107  Diastolic BP 73 81 75  Wt. (Lbs) 230 235.4 -  BMI 38.27 38.01 -      Physical Exam  Constitutional: She is oriented to person, place, and time. She appears well-developed and well-nourished.  HENT:  Right Ear: External ear normal.  Left Ear: External ear normal.  Cardiovascular: Normal rate, normal heart sounds and intact distal pulses.   No murmur heard. Pulmonary/Chest: Effort normal and breath sounds normal. She has no wheezes. She has no rales. She exhibits no tenderness.  Abdominal: Soft. Bowel sounds are normal. She exhibits no distension and no mass. There is no tenderness.  Musculoskeletal: Normal range of motion.  Neurological: She is alert and oriented to person, place, and time.  Skin: Skin is warm and dry.  Psychiatric: She has a normal mood and affect.     CMP Latest Ref Rng & Units 12/03/2015 12/19/2014  Glucose 65 - 99 mg/dL 89 96  BUN 7 - 25 mg/dL 19 18  Creatinine 8.650.50 - 0.99 mg/dL 7.840.88 6.960.86  Sodium 295135 - 146 mmol/L 140 140  Potassium 3.5 - 5.3 mmol/L 3.5 4.3  Chloride 98 - 110 mmol/L 99 98  CO2 20 - 31 mmol/L 26 30  Calcium 8.6 - 10.4 mg/dL 9.3 9.5  Total Protein 6.1 - 8.1 g/dL 7.1 -  Total Bilirubin 0.2 - 1.2 mg/dL 0.7 -  Alkaline Phos 33 - 130 U/L 60 -  AST 10 - 35 U/L 19 -  ALT 6 - 29 U/L 18 -    Lipid Panel     Component Value Date/Time   CHOL 188 12/03/2015 0852   TRIG 116 12/03/2015 0852   HDL 46 12/03/2015 0852   CHOLHDL 4.1 12/03/2015 0852   VLDL 23 12/03/2015 0852   LDLCALC 119 12/03/2015 0852   Lab Results  Component Value Date   HGBA1C 5.7 11/30/2015    Assessment & Plan:   1. Essential hypertension Controlled Educated about signs of hypotension as she is having commenced on clonidine and she knows to  notify the clinic - lisinopril-hydrochlorothiazide (PRINZIDE,ZESTORETIC) 20-25 MG tablet; Take 1 tablet by mouth daily.  Dispense: 30 tablet; Refill: 6 - COMPLETE METABOLIC PANEL WITH GFR  2. Prediabetes Diet-controlled - Hemoglobin A1c  3. Hot flashes due to menopause Was on Premarin in the past  Discussed risks and benefits of HRT. - cloNIDine (CATAPRES) 0.1 MG tablet; Take 1 tablet (0.1 mg total) by mouth at bedtime.  Dispense: 30 tablet; Refill: 3   Meds ordered this encounter  Medications  . cloNIDine (CATAPRES) 0.1 MG tablet    Sig: Take 1 tablet (0.1 mg total) by mouth at bedtime.    Dispense:  30 tablet    Refill:  3  . lisinopril-hydrochlorothiazide (PRINZIDE,ZESTORETIC) 20-25 MG tablet    Sig: Take 1 tablet by mouth daily.    Dispense:  30 tablet    Refill:  6    Follow-up: Return in about 3 weeks (around 05/11/2016) for Complete physical exam.   Jaclyn ShaggyEnobong Amao MD

## 2016-04-20 NOTE — Patient Instructions (Signed)
Menopause Menopause is the normal time of life when menstrual periods stop completely. Menopause is complete when you have missed 12 consecutive menstrual periods. It usually occurs between the ages of 48 years and 55 years. Very rarely does a woman develop menopause before the age of 40 years. At menopause, your ovaries stop producing the female hormones estrogen and progesterone. This can cause undesirable symptoms and also affect your health. Sometimes the symptoms may occur 4-5 years before the menopause begins. There is no relationship between menopause and:  Oral contraceptives.  Number of children you had.  Race.  The age your menstrual periods started (menarche). Heavy smokers and very thin women may develop menopause earlier in life. CAUSES  The ovaries stop producing the female hormones estrogen and progesterone.  Other causes include:  Surgery to remove both ovaries.  The ovaries stop functioning for no known reason.  Tumors of the pituitary gland in the brain.  Medical disease that affects the ovaries and hormone production.  Radiation treatment to the abdomen or pelvis.  Chemotherapy that affects the ovaries. SYMPTOMS   Hot flashes.  Night sweats.  Decrease in sex drive.  Vaginal dryness and thinning of the vagina causing painful intercourse.  Dryness of the skin and developing wrinkles.  Headaches.  Tiredness.  Irritability.  Memory problems.  Weight gain.  Bladder infections.  Hair growth of the face and chest.  Infertility. More serious symptoms include:  Loss of bone (osteoporosis) causing breaks (fractures).  Depression.  Hardening and narrowing of the arteries (atherosclerosis) causing heart attacks and strokes. DIAGNOSIS   When the menstrual periods have stopped for 12 straight months.  Physical exam.  Hormone studies of the blood. TREATMENT  There are many treatment choices and nearly as many questions about them. The  decisions to treat or not to treat menopausal changes is an individual choice made with your health care provider. Your health care provider can discuss the treatments with you. Together, you can decide which treatment will work best for you. Your treatment choices may include:   Hormone therapy (estrogen and progesterone).  Non-hormonal medicines.  Treating the individual symptoms with medicine (for example antidepressants for depression).  Herbal medicines that may help specific symptoms.  Counseling by a psychiatrist or psychologist.  Group therapy.  Lifestyle changes including:  Eating healthy.  Regular exercise.  Limiting caffeine and alcohol.  Stress management and meditation.  No treatment. HOME CARE INSTRUCTIONS   Take the medicine your health care provider gives you as directed.  Get plenty of sleep and rest.  Exercise regularly.  Eat a diet that contains calcium (good for the bones) and soy products (acts like estrogen hormone).  Avoid alcoholic beverages.  Do not smoke.  If you have hot flashes, dress in layers.  Take supplements, calcium, and vitamin D to strengthen bones.  You can use over-the-counter lubricants or moisturizers for vaginal dryness.  Group therapy is sometimes very helpful.  Acupuncture may be helpful in some cases. SEEK MEDICAL CARE IF:   You are not sure you are in menopause.  You are having menopausal symptoms and need advice and treatment.  You are still having menstrual periods after age 55 years.  You have pain with intercourse.  Menopause is complete (no menstrual period for 12 months) and you develop vaginal bleeding.  You need a referral to a specialist (gynecologist, psychiatrist, or psychologist) for treatment. SEEK IMMEDIATE MEDICAL CARE IF:   You have severe depression.  You have excessive vaginal bleeding.    You fell and think you have a broken bone.  You have pain when you urinate.  You develop leg or  chest pain.  You have a fast pounding heart beat (palpitations).  You have severe headaches.  You develop vision problems.  You feel a lump in your breast.  You have abdominal pain or severe indigestion.   This information is not intended to replace advice given to you by your health care provider. Make sure you discuss any questions you have with your health care provider.   Document Released: 08/27/2003 Document Revised: 02/06/2013 Document Reviewed: 01/03/2013 Elsevier Interactive Patient Education 2016 [employer]vier Inc.  

## 2016-04-21 ENCOUNTER — Telehealth: Payer: Self-pay

## 2016-04-21 LAB — HEMOGLOBIN A1C
HEMOGLOBIN A1C: 5.6 % (ref <5.7)
MEAN PLASMA GLUCOSE: 114 mg/dL

## 2016-04-21 NOTE — Telephone Encounter (Signed)
-----   Message from Jaclyn ShaggyEnobong Amao, MD sent at 04/21/2016  1:36 PM EDT ----- Please inform the patient that labs are normal. Thank you.

## 2016-04-21 NOTE — Telephone Encounter (Signed)
Writer called patient per Dr. Amao to discuss lab results.  Patient stated understanding.  

## 2016-04-29 ENCOUNTER — Other Ambulatory Visit: Payer: Self-pay | Admitting: Internal Medicine

## 2016-05-04 MED FILL — LISINOPRIL-HCTZ 20-25 MG TA: 20-25 | 30 days supply | Qty: 30 | Fill #0

## 2016-05-31 MED FILL — cloNIDine HCL 0.1 MG TABS: 0.1 | 30 days supply | Qty: 30 | Fill #1

## 2016-05-31 MED FILL — LISINOPRIL-HCTZ 20-25 MG TA: 20-25 | 30 days supply | Qty: 30 | Fill #1

## 2016-07-11 MED FILL — LISINOPRIL-HCTZ 20-25 MG TA: 20-25 | 30 days supply | Qty: 30 | Fill #2

## 2016-07-11 MED FILL — cloNIDine HCL 0.1 MG TABS: 0.1 | 30 days supply | Qty: 30 | Fill #2

## 2016-08-15 MED FILL — cloNIDine HCL 0.1 MG TABS: 0.1 | 30 days supply | Qty: 30 | Fill #3

## 2016-08-15 MED FILL — LISINOPRIL-HCTZ 20-25 MG TA: 20-25 | 30 days supply | Qty: 30 | Fill #3

## 2016-09-12 MED FILL — LISINOPRIL-HCTZ 20-25 MG TA: 20-25 | 30 days supply | Qty: 30 | Fill #4

## 2016-10-07 MED FILL — LISINOPRIL-HCTZ 20-25 MG TA: 20-25 | 30 days supply | Qty: 30 | Fill #5

## 2016-11-11 ENCOUNTER — Other Ambulatory Visit: Payer: Self-pay | Admitting: Family Medicine

## 2016-11-11 DIAGNOSIS — I10 Essential (primary) hypertension: Secondary | ICD-10-CM

## 2016-11-16 ENCOUNTER — Other Ambulatory Visit: Payer: Self-pay | Admitting: Family Medicine

## 2016-11-16 DIAGNOSIS — N951 Menopausal and female climacteric states: Secondary | ICD-10-CM

## 2017-06-19 ENCOUNTER — Ambulatory Visit: Payer: Self-pay | Attending: Family Medicine | Admitting: Family Medicine

## 2017-06-19 ENCOUNTER — Encounter: Payer: Self-pay | Admitting: Family Medicine

## 2017-06-19 VITALS — BP 97/65 | HR 65 | Temp 97.4°F | Wt 236.4 lb

## 2017-06-19 DIAGNOSIS — Z79899 Other long term (current) drug therapy: Secondary | ICD-10-CM | POA: Insufficient documentation

## 2017-06-19 DIAGNOSIS — G8929 Other chronic pain: Secondary | ICD-10-CM | POA: Insufficient documentation

## 2017-06-19 DIAGNOSIS — R7303 Prediabetes: Secondary | ICD-10-CM | POA: Insufficient documentation

## 2017-06-19 DIAGNOSIS — Z885 Allergy status to narcotic agent status: Secondary | ICD-10-CM | POA: Insufficient documentation

## 2017-06-19 DIAGNOSIS — I10 Essential (primary) hypertension: Secondary | ICD-10-CM | POA: Insufficient documentation

## 2017-06-19 DIAGNOSIS — Z0000 Encounter for general adult medical examination without abnormal findings: Secondary | ICD-10-CM

## 2017-06-19 DIAGNOSIS — Z9889 Other specified postprocedural states: Secondary | ICD-10-CM | POA: Insufficient documentation

## 2017-06-19 DIAGNOSIS — M545 Low back pain: Secondary | ICD-10-CM | POA: Insufficient documentation

## 2017-06-19 DIAGNOSIS — Z2821 Immunization not carried out because of patient refusal: Secondary | ICD-10-CM | POA: Insufficient documentation

## 2017-06-19 DIAGNOSIS — Z9049 Acquired absence of other specified parts of digestive tract: Secondary | ICD-10-CM | POA: Insufficient documentation

## 2017-06-19 MED ORDER — LISINOPRIL-HYDROCHLOROTHIAZIDE 20-25 MG PO TABS: 1.0000 | ORAL_TABLET | Freq: Every day | ORAL | 6 refills | Status: DC

## 2017-06-19 MED ORDER — METHOCARBAMOL 750 MG PO TABS: 750.0000 mg | ORAL_TABLET | Freq: Two times a day (BID) | ORAL | 2 refills | Status: DC | PRN

## 2017-06-19 NOTE — Progress Notes (Signed)
Subjective:  Patient ID: Natalie Cuevas, female    DOB: Sep 07, 1955  Age: 61 y.o. MRN: [id number]  CC: Hypertension and Medication Refill   HPI Natalie Cuevas is a 61 year old female with a history of hypertension, prediabetes last seen in the clinic 13 months ago who presents today for a follow-up visit.  She has been compliant with her antihypertensive and a low-sodium diet but does not exercise.  She has not made a sooner appointment due to the fact that she felt well she states.  She does have left-sided back pain which does not radiate down her lower extremity.  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 She currently does not have any analgesics.  Past Medical History:  Diagnosis Date  . Hypertension     Past Surgical History:  Procedure Laterality Date  . APPENDECTOMY    . CHOLECYSTECTOMY    . TONSILLECTOMY      Allergies  Allergen Reactions  . Codeine       Outpatient Medications Prior to Visit  Medication Sig Dispense Refill  . lisinopril-hydrochlorothiazide (PRINZIDE,ZESTORETIC) 20-25 MG tablet TAKE 1 TABLET BY MOUTH DAILY. 30 tablet 6  . cloNIDine (CATAPRES) 0.1 MG tablet Take 1 tablet (0.1 mg total) by mouth at bedtime. (Patient not taking: Reported on 06/19/2017) 30 tablet 3   No facility-administered medications prior to visit.     ROS Review of Systems  Constitutional: Negative for activity change, appetite change and fatigue.  HENT: Negative for congestion, sinus pressure and sore throat.   Eyes: Negative for visual disturbance.  Respiratory: Negative for cough, chest tightness, shortness of breath and wheezing.   Cardiovascular: Negative for chest pain and palpitations.  Gastrointestinal: Negative for abdominal distention, abdominal pain and constipation.  Endocrine: Negative for polydipsia.  Genitourinary: Negative for  dysuria and frequency.  Musculoskeletal: Positive for back pain. Negative for arthralgias.  Skin: Negative for rash.  Neurological: Negative for tremors, light-headedness and numbness.  Hematological: Does not bruise/bleed easily.  Psychiatric/Behavioral: Negative for agitation and behavioral problems.    Objective:  BP 97/65   Pulse 65   Temp (!) 97.4 F (36.3 C) (Oral)   Wt 236 lb 6.4 oz (107.2 kg)   SpO2 95%   BMI 39.34 kg/m   BP/Weight 06/19/2017 04/20/2016 9/70/2637  Systolic BP 97 858 850  Diastolic BP 65 73 81  Wt. (Lbs) 236.4 230 235.4  BMI 39.34 38.27 38.01      Physical Exam  Constitutional: She is oriented to person, place, and time. She appears well-developed and well-nourished.  Cardiovascular: Normal rate, normal heart sounds and intact distal pulses.  No murmur heard. Pulmonary/Chest: Effort normal and breath sounds normal. She has no wheezes. She has no rales. She exhibits no tenderness.  Abdominal: Soft. Bowel sounds are normal. She exhibits no distension and no mass. There is no tenderness.  Musculoskeletal: Normal range of motion. She exhibits tenderness (TTP of left side of back; negative straight leg raise bilaterally).  Neurological: She is alert and oriented to person, place, and time.  Psychiatric: She has a normal mood and affect.     Assessment & Plan:   1. Essential hypertension Controlled Low-sodium, DASH diet - lisinopril-hydrochlorothiazide (PRINZIDE,ZESTORETIC) 20-25 MG tablet; Take 1 tablet by mouth daily.  Dispense: 30 tablet; Refill: 6 - CMP14+EGFR - Lipid panel  2. Chronic left-sided low back  pain without sciatica Uncontrolled Likely from muscle spasm arising from heavy lifting Advised to apply heat - methocarbamol (ROBAXIN-750) 750 MG tablet; Take 1 tablet (750 mg total) by mouth 2 (two) times daily as needed for muscle spasms.  Dispense: 60 tablet; Refill: 2  3. Healthcare maintenance Declines flu shot Declines Pap smear,  mammogram, colonoscopy despite discussing with her the implications of her actions and risks involved   Meds ordered this encounter  Medications  . lisinopril-hydrochlorothiazide (PRINZIDE,ZESTORETIC) 20-25 MG tablet    Sig: Take 1 tablet by mouth daily.    Dispense:  30 tablet    Refill:  6  . methocarbamol (ROBAXIN-750) 750 MG tablet    Sig: Take 1 tablet (750 mg total) by mouth 2 (two) times daily as needed for muscle spasms.    Dispense:  60 tablet    Refill:  2    Follow-up: Return in about 6 months (around 12/17/2017) for Follow-up of chronic medical conditions.   Arnoldo Morale MD

## 2017-06-19 NOTE — Patient Instructions (Signed)

## 2017-06-20 LAB — LIPID PANEL
CHOLESTEROL TOTAL: 160 mg/dL (ref 100–199)
Chol/HDL Ratio: 3.6 ratio (ref 0.0–4.4)
HDL: 44 mg/dL (ref >39)
LDL CALC: 93 mg/dL (ref 0–99)
Triglycerides: 113 mg/dL (ref 0–149)
VLDL CHOLESTEROL CAL: 23 mg/dL (ref 5–40)

## 2017-06-20 LAB — CMP14+EGFR
A/G RATIO: 1.4 (ref 1.2–2.2)
ALK PHOS: 62 IU/L (ref 39–117)
ALT: 18 IU/L (ref 0–32)
AST: 21 IU/L (ref 0–40)
Albumin: 4.3 g/dL (ref 3.6–4.8)
BUN/Creatinine Ratio: 18 (ref 12–28)
BUN: 16 mg/dL (ref 8–27)
Bilirubin Total: 0.7 mg/dL (ref 0.0–1.2)
CALCIUM: 9.6 mg/dL (ref 8.7–10.3)
CO2: 26 mmol/L (ref 20–29)
CREATININE: 0.88 mg/dL (ref 0.57–1.00)
Chloride: 101 mmol/L (ref 96–106)
GFR calc non Af Amer: 70 mL/min/{1.73_m2} (ref >59)
GFR, EST AFRICAN AMERICAN: 81 mL/min/{1.73_m2} (ref >59)
GLOBULIN, TOTAL: 3 g/dL (ref 1.5–4.5)
Glucose: 88 mg/dL (ref 65–99)
Potassium: 3.6 mmol/L (ref 3.5–5.2)
Sodium: 142 mmol/L (ref 134–144)
Total Protein: 7.3 g/dL (ref 6.0–8.5)

## 2017-06-22 ENCOUNTER — Telehealth: Payer: Self-pay

## 2017-06-22 NOTE — Telephone Encounter (Signed)
Pt was called(both numbers) and there is no VM set up to leave a message.

## 2017-06-29 ENCOUNTER — Telehealth: Payer: Self-pay

## 2017-06-29 NOTE — Telephone Encounter (Signed)
Pt was called and there is no VM set up for either number on file.   If pt returns phone call please inform pt  that labs are normal. Thank you.

## 2018-01-04 ENCOUNTER — Ambulatory Visit: Payer: Self-pay | Attending: Family Medicine | Admitting: Physician Assistant

## 2018-01-04 VITALS — BP 110/72 | HR 79 | Temp 98.2°F | Resp 18 | Ht 65.0 in | Wt 249.0 lb

## 2018-01-04 DIAGNOSIS — I10 Essential (primary) hypertension: Secondary | ICD-10-CM | POA: Insufficient documentation

## 2018-01-04 DIAGNOSIS — H1032 Unspecified acute conjunctivitis, left eye: Secondary | ICD-10-CM | POA: Insufficient documentation

## 2018-01-04 DIAGNOSIS — Z760 Encounter for issue of repeat prescription: Secondary | ICD-10-CM | POA: Insufficient documentation

## 2018-01-04 DIAGNOSIS — M62830 Muscle spasm of back: Secondary | ICD-10-CM | POA: Insufficient documentation

## 2018-01-04 DIAGNOSIS — M62838 Other muscle spasm: Secondary | ICD-10-CM | POA: Insufficient documentation

## 2018-01-04 DIAGNOSIS — R739 Hyperglycemia, unspecified: Secondary | ICD-10-CM | POA: Insufficient documentation

## 2018-01-04 DIAGNOSIS — H5712 Ocular pain, left eye: Secondary | ICD-10-CM | POA: Insufficient documentation

## 2018-01-04 DIAGNOSIS — Z885 Allergy status to narcotic agent status: Secondary | ICD-10-CM | POA: Insufficient documentation

## 2018-01-04 MED ORDER — LISINOPRIL-HYDROCHLOROTHIAZIDE 20-25 MG PO TABS: 1.0000 | ORAL_TABLET | Freq: Every day | ORAL | 6 refills | Status: DC

## 2018-01-04 MED ORDER — ERYTHROMYCIN 5 MG/GM OP OINT: 1.0000 "application " | TOPICAL_OINTMENT | Freq: Every day | OPHTHALMIC | 0 refills | Status: DC

## 2018-01-04 MED ORDER — METHOCARBAMOL 750 MG PO TABS: 750.0000 mg | ORAL_TABLET | Freq: Two times a day (BID) | ORAL | 2 refills | Status: DC | PRN

## 2018-01-04 NOTE — Progress Notes (Signed)
Patient ID: Natalie RubensteinCarolyn R Cuevas, female   DOB: 1956-01-23, 62 y.o.   MRN: [id number]   Natalie Cuevas, is a 62 y.o. female  [id number]  [id number]  DOB - 1956-01-23  Subjective:  Chief Complaint and HPI: Natalie Cuevas is a 62 y.o. female here today for med RF.  Also having L eye pain and redness and drainage for 2-3 days.  Had pink eye about 5 years ago and erythromycin ointment helped.  This feels the same.  No URI s/sx.    Compliant with BP meds.  No CP/HA/ dizziness.  Having some muscle spasms bc taking care of her mom and has to roll her to change her diaper so her upper back and shoulders get tight.  Wants rf of muscle relaxer to help with this.  No radicular s/sx.    ROS:   Constitutional:  No f/c, No night sweats, No unexplained weight loss. EENT:  No vision changes, No blurry vision, No hearing changes. No mouth, throat, or ear problems.  Respiratory: No cough, No SOB Cardiac: No CP, no palpitations GI:  No abd pain, No N/V/D. GU: No Urinary s/sx Musculoskeletal: upper back stiffness Neuro: No headache, no dizziness, no motor weakness.  Skin: No rash Endocrine:  No polydipsia. No polyuria.  Psych: Denies SI/HI  No problems updated.  ALLERGIES: Allergies  Allergen Reactions  . Codeine     PAST MEDICAL HISTORY: Past Medical History:  Diagnosis Date  . Hypertension     MEDICATIONS AT HOME: Prior to Admission medications   Medication Sig Start Date End Date Taking? Authorizing Provider  lisinopril-hydrochlorothiazide (PRINZIDE,ZESTORETIC) 20-25 MG tablet Take 1 tablet by mouth daily. 01/04/18  Yes Anders SimmondsMcClung, Angela M, PA-C  methocarbamol (ROBAXIN-750) 750 MG tablet Take 1 tablet (750 mg total) by mouth 2 (two) times daily as needed for muscle spasms. 01/04/18  Yes Anders SimmondsMcClung, Angela M, PA-C  erythromycin ophthalmic ointment Place 1 application into the left eye at bedtime. 01/04/18   Anders SimmondsMcClung, Angela M, PA-C     Objective:  EXAM:   Vitals:   01/04/18 1044    BP: 110/72  Pulse: 79  Resp: 18  Temp: 98.2 F (36.8 C)  TempSrc: Oral  SpO2: 97%  Weight: 249 lb (112.9 kg)  Height: 5\' 5"  (1.651 m)    General appearance : A&OX3. NAD. Non-toxic-appearing HEENT: Atraumatic and Normocephalic.  PERRLA. EOM intact.  L conjunctivae with pale erythema and some exudate in inner canthus.  Fundi benign B.  R conjunctivae WNL.  No preauricular LN.  TM clear B. Mouth-MMM, post pharynx WNL w/o erythema, No PND. Neck: supple, no JVD. No cervical lymphadenopathy. No thyromegaly Chest/Lungs:  Breathing-non-labored, Good air entry bilaterally, breath sounds normal without rales, rhonchi, or wheezing  CVS: S1 S2 regular, no murmurs, gallops, rubs  Some trapezius spasm Extremities: Bilateral Lower Ext shows no edema, both legs are warm to touch with = pulse throughout Neurology:  CN II-XII grossly intact, Non focal.   Psych:  TP linear. J/I WNL. Normal speech. Appropriate eye contact and affect.  Skin:  No Rash  Data Review Lab Results  Component Value Date   HGBA1C 5.6 04/20/2016   HGBA1C 5.7 11/30/2015   HGBA1C 6.3 (H) 01/15/2015     Assessment & Plan   1. Essential hypertension Controlled-continue current regimen - lisinopril-hydrochlorothiazide (PRINZIDE,ZESTORETIC) 20-25 MG tablet; Take 1 tablet by mouth daily.  Dispense: 30 tablet; Refill: 6 - Comprehensive metabolic panel  2. Muscle spasm Also can try advil or tylenol - methocarbamol (ROBAXIN-750)  750 MG tablet; Take 1 tablet (750 mg total) by mouth 2 (two) times daily as needed for muscle spasms.  Dispense: 60 tablet; Refill: 2  3. Acute conjunctivitis of left eye, unspecified acute conjunctivitis type - erythromycin ophthalmic ointment; Place 1 application into the left eye at bedtime.  Dispense: 3.5 g; Refill: 0  4. Hyperglycemia I have had a lengthy discussion and provided education about insulin resistance and the intake of too much sugar/refined carbohydrates.  I have advised the  patient to work at a goal of eliminating sugary drinks, candy, desserts, sweets, refined sugars, processed foods, and white carbohydrates.  The patient expresses understanding.   - Hemoglobin A1c     Patient have been counseled extensively about nutrition and exercise  Return in about 6 months (around 07/07/2018) for f/up Dr Alvis Lemmings for htn and hyperglycemia.  The patient was given clear instructions to go to ER or return to medical center if symptoms don't improve, worsen or new problems develop. The patient verbalized understanding. The patient was told to call to get lab results if they haven't heard anything in the next week.     Georgian Co, PA-C [facility] [place], [place] [phone]   01/04/2018, 11:02 AM

## 2018-01-05 LAB — COMPREHENSIVE METABOLIC PANEL
ALBUMIN: 4.2 g/dL (ref 3.6–4.8)
ALK PHOS: 77 IU/L (ref 39–117)
ALT: 16 IU/L (ref 0–32)
AST: 19 IU/L (ref 0–40)
Albumin/Globulin Ratio: 1.3 (ref 1.2–2.2)
BUN / CREAT RATIO: 15 (ref 12–28)
BUN: 14 mg/dL (ref 8–27)
Bilirubin Total: 0.6 mg/dL (ref 0.0–1.2)
CO2: 28 mmol/L (ref 20–29)
CREATININE: 0.92 mg/dL (ref 0.57–1.00)
Calcium: 9.9 mg/dL (ref 8.7–10.3)
Chloride: 99 mmol/L (ref 96–106)
GFR calc Af Amer: 77 mL/min/{1.73_m2} (ref >59)
GFR calc non Af Amer: 66 mL/min/{1.73_m2} (ref >59)
GLUCOSE: 94 mg/dL (ref 65–99)
Globulin, Total: 3.2 g/dL (ref 1.5–4.5)
Potassium: 4 mmol/L (ref 3.5–5.2)
Sodium: 141 mmol/L (ref 134–144)
Total Protein: 7.4 g/dL (ref 6.0–8.5)

## 2018-01-05 LAB — HEMOGLOBIN A1C
ESTIMATED AVERAGE GLUCOSE: 126 mg/dL
HEMOGLOBIN A1C: 6 % — AB (ref 4.8–5.6)

## 2018-01-09 ENCOUNTER — Telehealth: Payer: Self-pay | Admitting: *Deleted

## 2018-01-09 NOTE — Telephone Encounter (Signed)
MA unable to reach patient due to not being available and no VM option. Please inform patient blood sugar being in the diabetic range. Patient needs to eliminate sugar intake and have a recheck completed in three months. Patients liver and kidney function is normal and follow up as planned.

## 2018-01-09 NOTE — Telephone Encounter (Signed)
-----   Message from Anders SimmondsAngela M McClung, [place]PA-C sent at 01/05/2018 12:02 PM EDT ----- Please call patient.  Her blood sugar is in the range of diabetes.  She needs to limit/eliminate sugar intake and we will will recheck this in a couple of months.  Her kidney and liver function and electrolytes are normal.  Follow up as planned.  Thanks, Georgian CoAngela McClung, PA-C

## 2018-05-15 ENCOUNTER — Telehealth: Payer: Self-pay | Admitting: Family Medicine

## 2018-05-15 NOTE — Telephone Encounter (Signed)
Patient called to get an appointment regarding back pain however, was upset when told there were no soon appointments. Patient was advised to go to the ED or ugent care if needed.

## 2018-09-20 ENCOUNTER — Other Ambulatory Visit: Payer: Self-pay | Admitting: Physician Assistant

## 2018-09-20 DIAGNOSIS — I10 Essential (primary) hypertension: Secondary | ICD-10-CM

## 2018-09-24 ENCOUNTER — Other Ambulatory Visit: Payer: Self-pay

## 2018-09-24 ENCOUNTER — Ambulatory Visit
Admission: EM | Admit: 2018-09-24 | Discharge: 2018-09-24 | Disposition: A | Discharge status: Home / Self Care | Payer: Self-pay | Attending: Family Medicine | Admitting: Family Medicine

## 2018-09-24 DIAGNOSIS — I10 Essential (primary) hypertension: Secondary | ICD-10-CM

## 2018-09-24 MED ORDER — LISINOPRIL-HYDROCHLOROTHIAZIDE 20-25 MG PO TABS: 1.0000 | ORAL_TABLET | Freq: Every day | ORAL | 0 refills | Status: DC

## 2018-09-24 NOTE — ED Provider Notes (Signed)
[facility]    CSN: [id number] Arrival date & time: 09/24/18  1227     History   Chief Complaint Chief Complaint  Patient presents with  . Medication Refill  . Hypertension    HPI Natalie Cuevas is a 62 y.o. female.   63 yo female here for "refill on my blood pressure medicine because I can't get a hold of my PCP".  Denies any symptoms.   The history is provided by the patient.  Medication Refill  Hypertension     Past Medical History:  Diagnosis Date  . Hypertension     Patient Active Problem List   Diagnosis Date Noted  . Prediabetes 11/30/2015  . Tooth abscess 12/01/2014  . Hypertension 12/01/2014    Past Surgical History:  Procedure Laterality Date  . APPENDECTOMY    . CHOLECYSTECTOMY    . TONSILLECTOMY      OB History   No obstetric history on file.      Home Medications    Prior to Admission medications   Medication Sig Start Date End Date Taking? Authorizing Provider  aspirin 81 MG chewable tablet Chew by mouth daily.   Yes [provider]  erythromycin ophthalmic ointment Place 1 application into the left eye at bedtime. 01/04/18   Anders Simmonds, PA-C  lisinopril-hydrochlorothiazide (PRINZIDE,ZESTORETIC) 20-25 MG tablet Take 1 tablet by mouth daily. 09/24/18   Payton Mccallum, MD  methocarbamol (ROBAXIN-750) 750 MG tablet Take 1 tablet (750 mg total) by mouth 2 (two) times daily as needed for muscle spasms. 01/04/18   Anders Simmonds, PA-C    Family History Family History  Problem Relation Age of Onset  . Hypertension Mother     Social History Social History   Tobacco Use  . Smoking status: Former Smoker    Packs/day: 0.25  . Smokeless tobacco: Never Used  Substance Use Topics  . Alcohol use: No  . Drug use: No    Types: "Crack" cocaine     Allergies   Codeine   Review of Systems Review of Systems   Physical Exam Triage Vital Signs ED Triage Vitals  Enc Vitals Group     BP 09/24/18 1247  128/90     Pulse Rate 09/24/18 1247 67     Resp 09/24/18 1247 16     Temp 09/24/18 1247 98.1 F (36.7 C)     Temp Source 09/24/18 1247 Oral     SpO2 09/24/18 1247 98 %     Weight 09/24/18 1245 245 lb (111.1 kg)     Height 09/24/18 1245 5\' 6"  (1.676 m)     Head Circumference --      Peak Flow --      Pain Score 09/24/18 1245 0     Pain Loc --      Pain Edu? --      Excl. in GC? --    No data found.  Updated Vital Signs BP 128/90 (BP Location: Right Arm)   Pulse 67   Temp 98.1 F (36.7 C) (Oral)   Resp 16   Ht 5\' 6"  (1.676 m)   Wt 111.1 kg   SpO2 98%   BMI 39.54 kg/m   Visual Acuity Right Eye Distance:   Left Eye Distance:   Bilateral Distance:    Right Eye Near:   Left Eye Near:    Bilateral Near:     Physical Exam Vitals signs and nursing note reviewed.  Constitutional:  General: She is not in acute distress.    Appearance: She is not toxic-appearing or diaphoretic.  Cardiovascular:     Rate and Rhythm: Normal rate.  Pulmonary:     Effort: Pulmonary effort is normal. No respiratory distress.  Neurological:     Mental Status: She is alert.      UC Treatments / Results  Labs (all labs ordered are listed, but only abnormal results are displayed) Labs Reviewed - No data to display  EKG None  Radiology No results found.  Procedures Procedures (including critical care time)  Medications Ordered in UC Medications - No data to display  Initial Impression / Assessment and Plan / UC Course  I have reviewed the triage vital signs and the nursing notes.  Pertinent labs & imaging results that were available during my care of the patient were reviewed by me and considered in my medical decision making (see chart for details).      Final Clinical Impressions(s) / UC Diagnoses   Final diagnoses:  Essential hypertension     Discharge Instructions     Follow up with primary care provider for further refills and management of her chronic  hypertension condition    ED Prescriptions    Medication Sig Dispense Auth. Provider   lisinopril-hydrochlorothiazide (PRINZIDE,ZESTORETIC) 20-25 MG tablet Take 1 tablet by mouth daily. 30 tablet Payton Mccallum, MD      1. diagnosis reviewed with patient 2. rx as per orders above; reviewed possible side effects, interactions, risks and benefits  3. Follow-up with PCP  Controlled Substance Prescriptions [place] Controlled Substance Registry consulted? Not Applicable   Payton Mccallum, MD 09/24/18 [phone]

## 2018-09-24 NOTE — ED Triage Notes (Signed)
Patient states that she is running out of BP medication. Patient states that she tried calling her Primary doctor but could not get through today, patient sees [facility] in [place] and currently does not have insurance. Patient states that she takes Lisinopril-HCTZ 20-25mg  once daily.

## 2018-09-24 NOTE — Discharge Instructions (Signed)
Follow up with primary care provider for further refills and management of her chronic hypertension condition

## 2018-09-26 ENCOUNTER — Ambulatory Visit: Payer: Self-pay | Attending: Family Medicine | Admitting: Family Medicine

## 2018-09-26 ENCOUNTER — Other Ambulatory Visit: Payer: Self-pay

## 2018-10-25 ENCOUNTER — Other Ambulatory Visit: Payer: Self-pay | Admitting: Family Medicine

## 2018-10-25 ENCOUNTER — Other Ambulatory Visit: Payer: Self-pay | Admitting: Physician Assistant

## 2018-10-25 DIAGNOSIS — I10 Essential (primary) hypertension: Secondary | ICD-10-CM

## 2018-10-25 NOTE — Telephone Encounter (Signed)
New Message   1) Medication(s) Requested (by name): lisinopril-hydrochlorothiazide (PRINZIDE,ZESTORETIC) 20-25 MG tablet  2) Pharmacy of Choice: [facility] on [address] in [place]  3) Special Requests:   Approved medications will be sent to the pharmacy, we will reach out if there is an issue.  Requests made after 3pm may not be addressed until the following business day!  If a patient is unsure of the name of the medication(s) please note and ask patient to call back when they are able to provide all info, do not send to responsible party until all information is available!

## 2018-10-26 MED ORDER — LISINOPRIL-HYDROCHLOROTHIAZIDE 20-25 MG PO TABS: 1.0000 | ORAL_TABLET | Freq: Every day | ORAL | 0 refills | Status: DC

## 2018-10-29 ENCOUNTER — Other Ambulatory Visit: Payer: Self-pay

## 2018-10-29 ENCOUNTER — Encounter: Payer: Self-pay | Admitting: Family Medicine

## 2018-10-29 ENCOUNTER — Ambulatory Visit: Payer: Self-pay | Attending: Family Medicine | Admitting: Family Medicine

## 2018-10-29 DIAGNOSIS — I10 Essential (primary) hypertension: Secondary | ICD-10-CM

## 2018-10-29 DIAGNOSIS — R7303 Prediabetes: Secondary | ICD-10-CM

## 2018-10-29 MED ORDER — LISINOPRIL-HYDROCHLOROTHIAZIDE 20-25 MG PO TABS: 1.0000 | ORAL_TABLET | Freq: Every day | ORAL | 1 refills | Status: DC

## 2018-10-29 NOTE — Progress Notes (Signed)
Virtual Visit via Telephone Note  I connected with Natalie Cuevas, on 10/29/2018 at 3:05 PM by telephone due to the COVID-19 pandemic and verified that I am speaking with the correct person using two identifiers.   Consent: I discussed the limitations, risks, security and privacy concerns of performing an evaluation and management service by telephone and the availability of in person appointments. I also discussed with the patient that there may be a patient responsible charge related to this service. The patient expressed understanding and agreed to proceed.   Location of Patient: Home  Location of Provider: Clinic   Persons participating in Telemedicine visit: Adraine Biffle Farrington-CMA Dr. Felecia Shelling     History of Present Illness: Natalie Cuevas is a 63 year old female with a history of hypertension, prediabetes seen for a follow-up visit today. She endorses compliance with her antihypertensive and her blood pressure last checked at urgent care last month was 128/90. She denies chest pains or shortness of breath and has no pedal edema. Denies adverse effects from her antihypertensive. She is not up-to-date on her healthcare maintenance and needs Pap smear, mammogram and colonoscopy but has no medical coverage and would like to wait until she turns 63 in the next 6 months. She has no additional concerns today.   Past Medical History:  Diagnosis Date  . Hypertension    Allergies  Allergen Reactions  . Codeine     Current Outpatient Medications on File Prior to Visit  Medication Sig Dispense Refill  . aspirin 81 MG chewable tablet Chew by mouth daily.    Marland Kitchen lisinopril-hydrochlorothiazide (ZESTORETIC) 20-25 MG tablet Take 1 tablet by mouth daily. 30 tablet 0  . erythromycin ophthalmic ointment Place 1 application into the left eye at bedtime. (Patient not taking: Reported on 10/29/2018) 3.5 g 0  . methocarbamol (ROBAXIN-750) 750 MG tablet Take 1 tablet  (750 mg total) by mouth 2 (two) times daily as needed for muscle spasms. (Patient not taking: Reported on 10/29/2018) 60 tablet 2   No current facility-administered medications on file prior to visit.     Observations/Objective: Awake, alert, oriented x3 Not in acute distress  CMP Latest Ref Rng & Units 01/04/2018 06/19/2017 04/20/2016  Glucose 65 - 99 mg/dL 94 88 95  BUN 8 - 27 mg/dL _0 Creatinine 0.57 - 1.00 mg/dL 0.92 0.88 1.07(H)  Sodium 134 - 144 mmol/L 141 142 139  Potassium 3.5 - 5.2 mmol/L 4.0 3.6 3.8  Chloride 96 - 106 mmol/L 99 101 100  CO2 20 - 29 mmol/L _1 Calcium 8.7 - 10.3 mg/dL 9.9 9.6 10.0  Total Protein 6.0 - 8.5 g/dL 7.4 7.3 7.2  Total Bilirubin 0.0 - 1.2 mg/dL 0.6 0.7 0.7  Alkaline Phos 39 - 117 IU/L 77 62 58  AST 0 - 40 IU/L _2 ALT 0 - 32 IU/L _3 Lipid Panel     Component Value Date/Time   CHOL 160 06/19/2017 1028   TRIG 113 06/19/2017 1028   HDL 44 06/19/2017 1028   CHOLHDL 3.6 06/19/2017 1028   CHOLHDL 4.1 12/03/2015 0852   VLDL 23 12/03/2015 0852   LDLCALC 93 06/19/2017 1028    Lab Results  Component Value Date   HGBA1C 6.0 (H) 01/04/2018    Assessment and Plan: 1. Essential hypertension Controlled - lisinopril-hydrochlorothiazide (ZESTORETIC) 20-25 MG tablet; Take 1 tablet by mouth daily.  Dispense: 90 tablet; Refill: 1 - CMP14+EGFR; Future -  Lipid panel; Future  2. Prediabetes Last A1c was 6.0 Diabetic diet, lifestyle modifications to prevent development of diabetes mellitus - Hemoglobin A1c; Future  Healthcare maintenance-she currently has no medical insurance but will be obtaining Medicare on her birthday in 103 months.  She would like to hold off on this till then.  Follow Up Instructions: Return in about 6 months (around 05/01/2019).    I discussed the assessment and treatment plan with the patient. The patient was provided an opportunity to ask questions and all were answered. The patient agreed with  the plan and demonstrated an understanding of the instructions.   The patient was advised to call back or seek an in-person evaluation if the symptoms worsen or if the condition fails to improve as anticipated.     I provided 11 minutes total of non-face-to-face time during this encounter including median intraservice time, reviewing previous notes, labs, imaging, medications and explaining diagnosis and management.     Charlott Rakes, MD, FAAFP. [facility] and [place], [place]   10/29/2018, 3:05 PM

## 2018-10-29 NOTE — Progress Notes (Signed)
Patient has been called and DOB has been verified. Patient has been screened and transferred to PCP to start phone visit.     

## 2018-11-05 ENCOUNTER — Ambulatory Visit: Payer: Self-pay | Attending: Family Medicine

## 2018-11-05 ENCOUNTER — Other Ambulatory Visit: Payer: Self-pay

## 2018-11-05 DIAGNOSIS — I10 Essential (primary) hypertension: Secondary | ICD-10-CM

## 2018-11-05 DIAGNOSIS — R7303 Prediabetes: Secondary | ICD-10-CM

## 2018-11-06 ENCOUNTER — Other Ambulatory Visit: Payer: Self-pay | Admitting: Family Medicine

## 2018-11-06 LAB — CMP14+EGFR
ALT: 18 IU/L (ref 0–32)
AST: 17 IU/L (ref 0–40)
Albumin/Globulin Ratio: 1.4 (ref 1.2–2.2)
Albumin: 4 g/dL (ref 3.8–4.8)
Alkaline Phosphatase: 76 IU/L (ref 39–117)
BUN/Creatinine Ratio: 20 (ref 12–28)
BUN: 17 mg/dL (ref 8–27)
Bilirubin Total: 0.4 mg/dL (ref 0.0–1.2)
CO2: 27 mmol/L (ref 20–29)
Calcium: 9.6 mg/dL (ref 8.7–10.3)
Chloride: 103 mmol/L (ref 96–106)
Creatinine, Ser: 0.86 mg/dL (ref 0.57–1.00)
GFR calc Af Amer: 83 mL/min/{1.73_m2} (ref >59)
GFR calc non Af Amer: 72 mL/min/{1.73_m2} (ref >59)
Globulin, Total: 2.9 g/dL (ref 1.5–4.5)
Glucose: 100 mg/dL — ABNORMAL HIGH (ref 65–99)
Potassium: 4 mmol/L (ref 3.5–5.2)
Sodium: 143 mmol/L (ref 134–144)
Total Protein: 6.9 g/dL (ref 6.0–8.5)

## 2018-11-06 LAB — LIPID PANEL
Chol/HDL Ratio: 4.6 ratio — ABNORMAL HIGH (ref 0.0–4.4)
Cholesterol, Total: 185 mg/dL (ref 100–199)
HDL: 40 mg/dL (ref >39)
LDL Calculated: 124 mg/dL — ABNORMAL HIGH (ref 0–99)
Triglycerides: 103 mg/dL (ref 0–149)
VLDL Cholesterol Cal: 21 mg/dL (ref 5–40)

## 2018-11-06 LAB — HEMOGLOBIN A1C
Est. average glucose Bld gHb Est-mCnc: 134 mg/dL
Hgb A1c MFr Bld: 6.3 % — ABNORMAL HIGH (ref 4.8–5.6)

## 2018-11-06 MED ORDER — METFORMIN HCL 500 MG PO TABS: 500.0000 mg | ORAL_TABLET | Freq: Two times a day (BID) | ORAL | 1 refills | Status: DC

## 2018-11-08 ENCOUNTER — Telehealth: Payer: Self-pay

## 2018-11-08 NOTE — Telephone Encounter (Signed)
Patient was called on all 3 numbers listed and no answer or voicemail set up.

## 2018-11-08 NOTE — Telephone Encounter (Signed)
-----   Message from Enobong Newlin, MD sent at 11/06/2018  4:55 PM EDT ----- Cholesterol is elevated.  Please advised to work on low-cholesterol diet, weight loss and exercise.  A1c 6.3 which has trended up from 6.0 but she is still in the prediabetic range.  I have sent a prescription for metformin to her pharmacy and will need her to work on adhering to diabetic diet to prevent development of diabetes mellitus. 

## 2018-11-16 ENCOUNTER — Telehealth: Payer: Self-pay

## 2018-11-16 NOTE — Telephone Encounter (Signed)
Patient was called and a voicemail was left informing patient to return phone call for lab results. 

## 2018-11-16 NOTE — Telephone Encounter (Signed)
-----   Message from Hoy Register, MD sent at 11/06/2018  4:55 PM EDT ----- Cholesterol is elevated.  Please advised to work on low-cholesterol diet, weight loss and exercise.  A1c 6.3 which has trended up from 6.0 but she is still in the prediabetic range.  I have sent a prescription for metformin to her pharmacy and will need her to work on adhering to diabetic diet to prevent development of diabetes mellitus.

## 2019-04-23 ENCOUNTER — Encounter: Payer: Self-pay | Admitting: Family Medicine

## 2019-04-23 ENCOUNTER — Ambulatory Visit: Payer: Self-pay | Attending: Family Medicine | Admitting: Family Medicine

## 2019-04-23 ENCOUNTER — Other Ambulatory Visit: Payer: Self-pay

## 2019-04-23 VITALS — BP 113/75 | HR 76 | Temp 98.2°F | Ht 66.0 in | Wt 264.0 lb

## 2019-04-23 DIAGNOSIS — I10 Essential (primary) hypertension: Secondary | ICD-10-CM

## 2019-04-23 DIAGNOSIS — Z1159 Encounter for screening for other viral diseases: Secondary | ICD-10-CM

## 2019-04-23 DIAGNOSIS — Z6841 Body Mass Index (BMI) 40.0 and over, adult: Secondary | ICD-10-CM

## 2019-04-23 DIAGNOSIS — R7303 Prediabetes: Secondary | ICD-10-CM

## 2019-04-23 LAB — POCT GLYCOSYLATED HEMOGLOBIN (HGB A1C): HbA1c, POC (prediabetic range): 5.7 % (ref 5.7–6.4)

## 2019-04-23 MED ORDER — LISINOPRIL-HYDROCHLOROTHIAZIDE 20-25 MG PO TABS: 1.0000 | ORAL_TABLET | Freq: Every day | ORAL | 1 refills | Status: DC

## 2019-04-23 NOTE — Progress Notes (Signed)
Subjective:  Patient ID: Natalie Cuevas, female    DOB: 1956-05-11  Age: 63 y.o. MRN: [id number]  CC: Hypertension   HPI Natalie Cuevas is a 63 year old female with a history of hypertension, prediabetes (A1c 5.7), morbid obesity here for follow-up visit today. She has been compliant with her antihypertensive but does not exercise much.  With regards to her prediabetes she has been on diet control and Metformin was prescribed previously however she never picked this up from the pharmacy as she was unaware.  Her A1c is 5.7 which has improved from 6.0 previously She denies the presence of chest pain, dyspnea or other additional symptoms.  Past Medical History:  Diagnosis Date  . Hypertension     Past Surgical History:  Procedure Laterality Date  . APPENDECTOMY    . CHOLECYSTECTOMY    . TONSILLECTOMY      Family History  Problem Relation Age of Onset  . Hypertension Mother     Allergies  Allergen Reactions  . Codeine     Outpatient Medications Prior to Visit  Medication Sig Dispense Refill  . aspirin 81 MG chewable tablet Chew by mouth daily.    Marland Kitchen lisinopril-hydrochlorothiazide (ZESTORETIC) 20-25 MG tablet Take 1 tablet by mouth daily. 90 tablet 1  . erythromycin ophthalmic ointment Place 1 application into the left eye at bedtime. (Patient not taking: Reported on 10/29/2018) 3.5 g 0  . metFORMIN (GLUCOPHAGE) 500 MG tablet Take 1 tablet (500 mg total) by mouth 2 (two) times daily with a meal. (Patient not taking: Reported on 04/23/2019) 180 tablet 1  . methocarbamol (ROBAXIN-750) 750 MG tablet Take 1 tablet (750 mg total) by mouth 2 (two) times daily as needed for muscle spasms. (Patient not taking: Reported on 10/29/2018) 60 tablet 2   No facility-administered medications prior to visit.      ROS Review of Systems  Constitutional: Negative for activity change, appetite change and fatigue.  HENT: Negative for congestion, sinus pressure and sore throat.   Eyes:  Negative for visual disturbance.  Respiratory: Negative for cough, chest tightness, shortness of breath and wheezing.   Cardiovascular: Negative for chest pain and palpitations.  Gastrointestinal: Negative for abdominal distention, abdominal pain and constipation.  Endocrine: Negative for polydipsia.  Genitourinary: Negative for dysuria and frequency.  Musculoskeletal: Negative for arthralgias and back pain.  Skin: Negative for rash.  Neurological: Negative for tremors, light-headedness and numbness.  Hematological: Does not bruise/bleed easily.  Psychiatric/Behavioral: Negative for agitation and behavioral problems.    Objective:  BP 113/75   Pulse 76   Temp 98.2 F (36.8 C) (Oral)   Ht 5\' 6"  (1.676 m)   Wt 264 lb (119.7 kg)   SpO2 95%   BMI 42.61 kg/m   BP/Weight 04/23/2019 09/24/2018 01/04/2018  Systolic BP 113 128 110  Diastolic BP 75 90 72  Wt. (Lbs) 264 245 249  BMI 42.61 39.54 41.44      Physical Exam Constitutional:      Appearance: She is well-developed.  Neck:     Vascular: No JVD.  Cardiovascular:     Rate and Rhythm: Normal rate.     Heart sounds: Normal heart sounds. No murmur.  Pulmonary:     Effort: Pulmonary effort is normal.     Breath sounds: Normal breath sounds. No wheezing or rales.  Chest:     Chest wall: No tenderness.  Abdominal:     General: Bowel sounds are normal. There is no distension.  Palpations: Abdomen is soft. There is no mass.     Tenderness: There is no abdominal tenderness.  Musculoskeletal: Normal range of motion.     Right lower leg: No edema.     Left lower leg: No edema.  Neurological:     Mental Status: She is alert and oriented to person, place, and time.  Psychiatric:        Mood and Affect: Mood normal.     CMP Latest Ref Rng & Units 11/05/2018 01/04/2018 06/19/2017  Glucose 65 - 99 mg/dL 100(H) 94 88  BUN 8 - 27 mg/dL 17 14 16   Creatinine 0.57 - 1.00 mg/dL 0.86 0.92 0.88  Sodium 134 - 144 mmol/L 143 141 142   Potassium 3.5 - 5.2 mmol/L 4.0 4.0 3.6  Chloride 96 - 106 mmol/L 103 99 101  CO2 20 - 29 mmol/L 27 28 26   Calcium 8.7 - 10.3 mg/dL 9.6 9.9 9.6  Total Protein 6.0 - 8.5 g/dL 6.9 7.4 7.3  Total Bilirubin 0.0 - 1.2 mg/dL 0.4 0.6 0.7  Alkaline Phos 39 - 117 IU/L 76 77 62  AST 0 - 40 IU/L 17 19 21   ALT 0 - 32 IU/L 18 16 18     Lipid Panel     Component Value Date/Time   CHOL 185 11/05/2018 1209   TRIG 103 11/05/2018 1209   HDL 40 11/05/2018 1209   CHOLHDL 4.6 (H) 11/05/2018 1209   CHOLHDL 4.1 12/03/2015 0852   VLDL 23 12/03/2015 0852   LDLCALC 124 (H) 11/05/2018 1209    CBC    Component Value Date/Time   WBC 9.4 01/15/2015 0949   RBC 5.10 01/15/2015 0949   HGB 14.6 01/15/2015 0949   HCT 43.8 01/15/2015 0949   PLT 284 01/15/2015 0949   MCV 85.9 01/15/2015 0949   MCH 28.6 01/15/2015 0949   MCHC 33.3 01/15/2015 0949   RDW 14.4 01/15/2015 0949    Lab Results  Component Value Date   HGBA1C 5.7 04/23/2019    Assessment & Plan:   1. Essential hypertension Controlled Counseled on blood pressure goal of less than 130/80, low-sodium, DASH diet, medication compliance, 150 minutes of moderate intensity exercise per week. Discussed medication compliance, adverse effects. - Basic Metabolic Panel - lisinopril-hydrochlorothiazide (ZESTORETIC) 20-25 MG tablet; Take 1 tablet by mouth daily.  Dispense: 90 tablet; Refill: 1  2. Prediabetes Diet controlled with A1c of 5.7 which is down from 6.0 previously She never commenced Metformin Will discontinue - POCT glycosylated hemoglobin (Hb A1C)  3. Screening for viral disease - HIV antibody (with reflex)  4. Morbid obesity ([place]) Counseled on exercise, reducing caloric intake   Health Care Maintenance: She is due for Pap smear, mammogram, colonoscopy however she would like to wait until she obtains Medicare.  She will call to schedule her welcome to Medicare exam once she obtains Medicare  Meds ordered this encounter  Medications   . lisinopril-hydrochlorothiazide (ZESTORETIC) 20-25 MG tablet    Sig: Take 1 tablet by mouth daily.    Dispense:  90 tablet    Refill:  1    Follow-up: Return in about 6 months (around 10/21/2019) for Hypertension.       Charlott Rakes, MD, FAAFP. [facility] and [place], [place]   04/23/2019, 12:11 PM

## 2019-04-24 ENCOUNTER — Telehealth: Payer: Self-pay

## 2019-04-24 LAB — BASIC METABOLIC PANEL
BUN/Creatinine Ratio: 18 (ref 12–28)
BUN: 15 mg/dL (ref 8–27)
CO2: 27 mmol/L (ref 20–29)
Calcium: 9.6 mg/dL (ref 8.7–10.3)
Chloride: 99 mmol/L (ref 96–106)
Creatinine, Ser: 0.82 mg/dL (ref 0.57–1.00)
GFR calc Af Amer: 87 mL/min/{1.73_m2} (ref >59)
GFR calc non Af Amer: 76 mL/min/{1.73_m2} (ref >59)
Glucose: 106 mg/dL — ABNORMAL HIGH (ref 65–99)
Potassium: 4 mmol/L (ref 3.5–5.2)
Sodium: 141 mmol/L (ref 134–144)

## 2019-04-24 LAB — HIV ANTIBODY (ROUTINE TESTING W REFLEX): HIV Screen 4th Generation wRfx: NONREACTIVE

## 2019-04-24 NOTE — Telephone Encounter (Signed)
-----   Message from Charlott Rakes, MD sent at 04/24/2019  1:41 PM EST ----- Please inform the patient that labs are normal. Thank you.

## 2019-04-24 NOTE — Telephone Encounter (Signed)
Patient name and DOB has been verified Patient was informed of lab results. Patient had no questions.  

## 2019-06-27 ENCOUNTER — Other Ambulatory Visit: Payer: Self-pay | Admitting: Family

## 2019-06-27 DIAGNOSIS — Z1231 Encounter for screening mammogram for malignant neoplasm of breast: Secondary | ICD-10-CM

## 2019-07-17 ENCOUNTER — Ambulatory Visit
Admission: RE | Admit: 2019-07-17 | Discharge: 2019-07-17 | Disposition: A | Discharge status: Home / Self Care | Payer: Medicare HMO | Source: Ambulatory Visit | Attending: Family | Admitting: Family

## 2019-07-17 ENCOUNTER — Other Ambulatory Visit: Payer: Self-pay

## 2019-07-17 DIAGNOSIS — Z1231 Encounter for screening mammogram for malignant neoplasm of breast: Secondary | ICD-10-CM

## 2019-07-19 ENCOUNTER — Other Ambulatory Visit: Payer: Self-pay | Admitting: Family

## 2019-07-19 DIAGNOSIS — R928 Other abnormal and inconclusive findings on diagnostic imaging of breast: Secondary | ICD-10-CM

## 2019-07-19 DIAGNOSIS — N6310 Unspecified lump in the right breast, unspecified quadrant: Secondary | ICD-10-CM

## 2019-07-29 ENCOUNTER — Ambulatory Visit
Admission: RE | Admit: 2019-07-29 | Discharge: 2019-07-29 | Disposition: A | Discharge status: Home / Self Care | Payer: Medicare HMO | Source: Ambulatory Visit | Attending: Family | Admitting: Family

## 2019-07-29 DIAGNOSIS — N6310 Unspecified lump in the right breast, unspecified quadrant: Secondary | ICD-10-CM | POA: Diagnosis present

## 2019-07-29 DIAGNOSIS — R928 Other abnormal and inconclusive findings on diagnostic imaging of breast: Secondary | ICD-10-CM | POA: Insufficient documentation

## 2019-10-18 LAB — COLOGUARD: COLOGUARD: NEGATIVE

## 2019-10-21 ENCOUNTER — Other Ambulatory Visit: Payer: Self-pay | Admitting: Family Medicine

## 2019-10-21 DIAGNOSIS — I10 Essential (primary) hypertension: Secondary | ICD-10-CM

## 2020-02-19 ENCOUNTER — Other Ambulatory Visit: Payer: Self-pay | Admitting: Family Medicine

## 2020-02-19 DIAGNOSIS — I10 Essential (primary) hypertension: Secondary | ICD-10-CM

## 2020-12-04 ENCOUNTER — Other Ambulatory Visit: Payer: Self-pay | Admitting: Family

## 2020-12-04 DIAGNOSIS — E785 Hyperlipidemia, unspecified: Secondary | ICD-10-CM | POA: Diagnosis not present

## 2020-12-04 DIAGNOSIS — R7303 Prediabetes: Secondary | ICD-10-CM | POA: Diagnosis not present

## 2020-12-04 DIAGNOSIS — L237 Allergic contact dermatitis due to plants, except food: Secondary | ICD-10-CM | POA: Diagnosis not present

## 2020-12-04 DIAGNOSIS — Z1231 Encounter for screening mammogram for malignant neoplasm of breast: Secondary | ICD-10-CM | POA: Diagnosis not present

## 2020-12-04 DIAGNOSIS — E559 Vitamin D deficiency, unspecified: Secondary | ICD-10-CM | POA: Diagnosis not present

## 2020-12-04 DIAGNOSIS — I10 Essential (primary) hypertension: Secondary | ICD-10-CM | POA: Diagnosis not present

## 2020-12-04 DIAGNOSIS — B351 Tinea unguium: Secondary | ICD-10-CM | POA: Diagnosis not present

## 2020-12-10 ENCOUNTER — Other Ambulatory Visit: Payer: Self-pay

## 2020-12-10 ENCOUNTER — Ambulatory Visit
Admission: RE | Admit: 2020-12-10 | Discharge: 2020-12-10 | Disposition: A | Discharge status: Home / Self Care | Payer: Medicare Other | Source: Ambulatory Visit | Attending: Family | Admitting: Family

## 2020-12-10 DIAGNOSIS — Z1231 Encounter for screening mammogram for malignant neoplasm of breast: Secondary | ICD-10-CM | POA: Diagnosis not present

## 2021-02-06 DIAGNOSIS — L237 Allergic contact dermatitis due to plants, except food: Secondary | ICD-10-CM | POA: Diagnosis not present

## 2021-02-24 IMAGING — MG DIGITAL SCREENING BILAT W/ TOMO W/ CAD
8 series · 8 of 24 positions shown · non-contrast
Comparison: Previous exam(s).

ACR Breast Density Category a: The breast tissue is almost entirely
fatty.

CLINICAL DATA: Screening.

EXAM:
DIGITAL SCREENING BILATERAL MAMMOGRAM WITH TOMO AND CAD

[R MLO synth-2D]
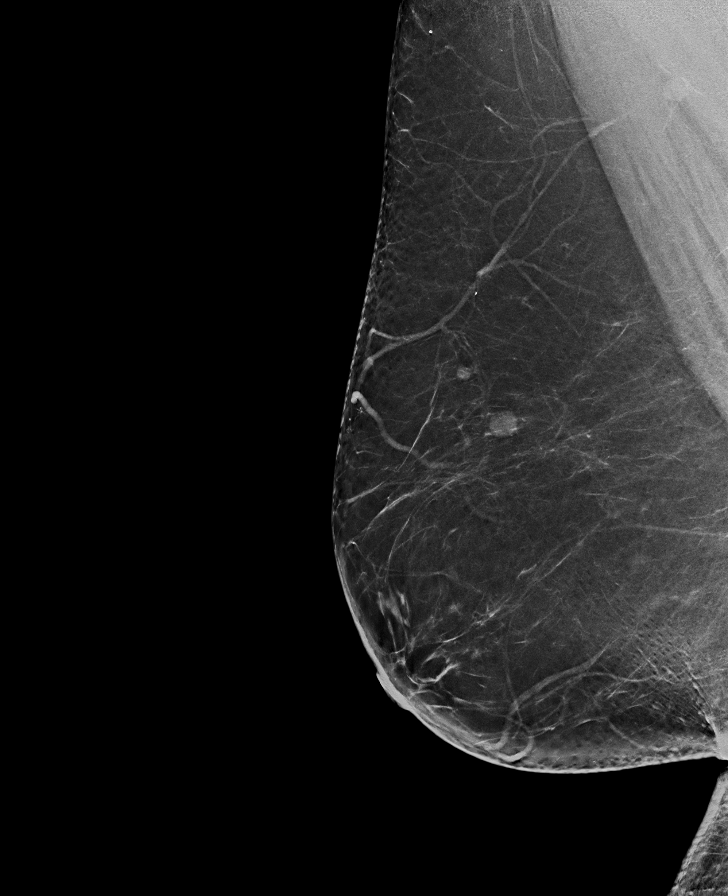

[L CC synth-2D]
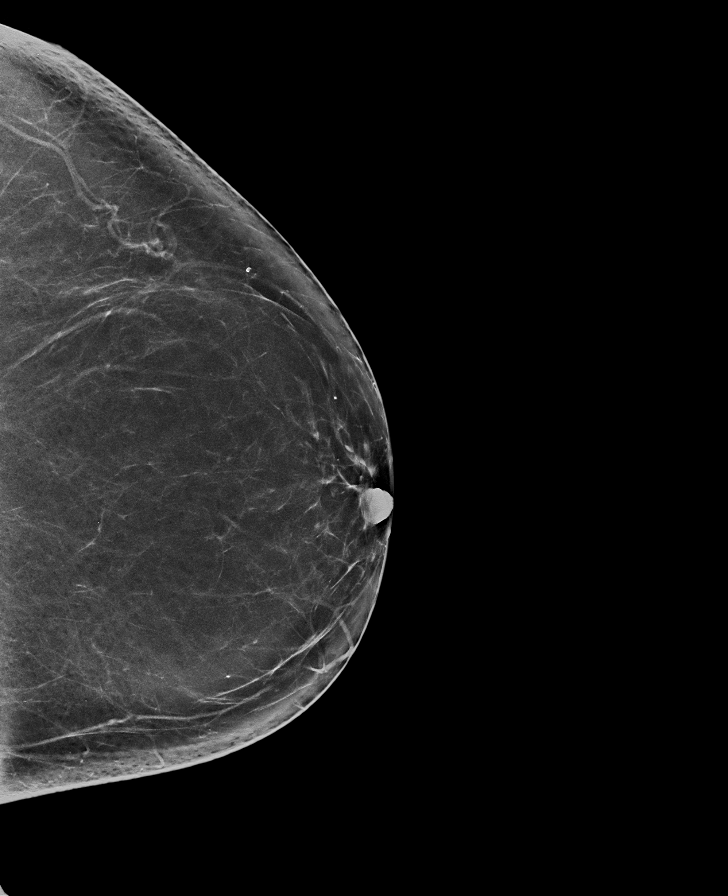

[L MLO synth-2D]
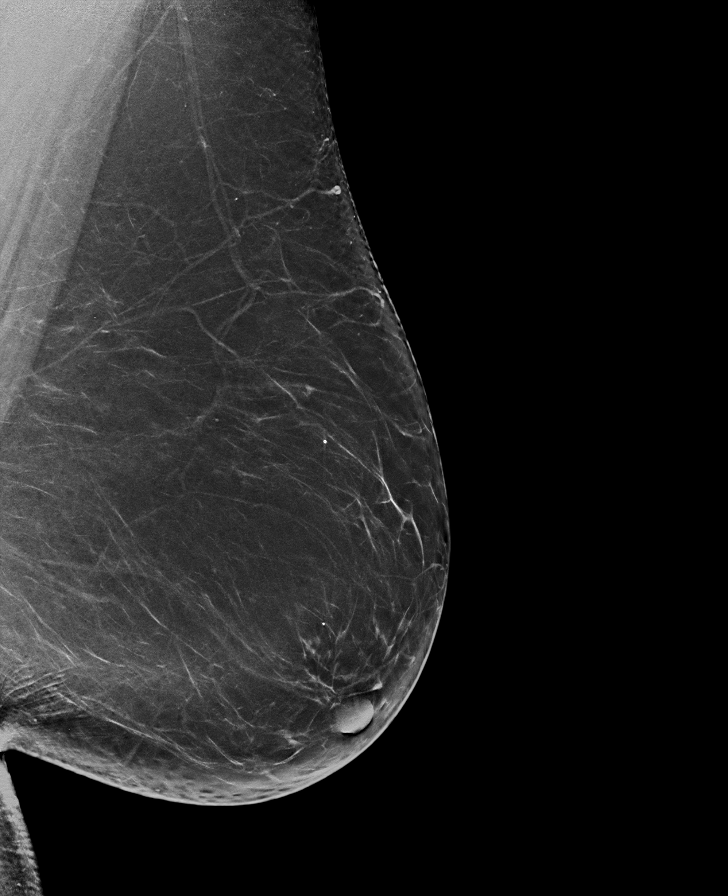

[R CC synth-2D]
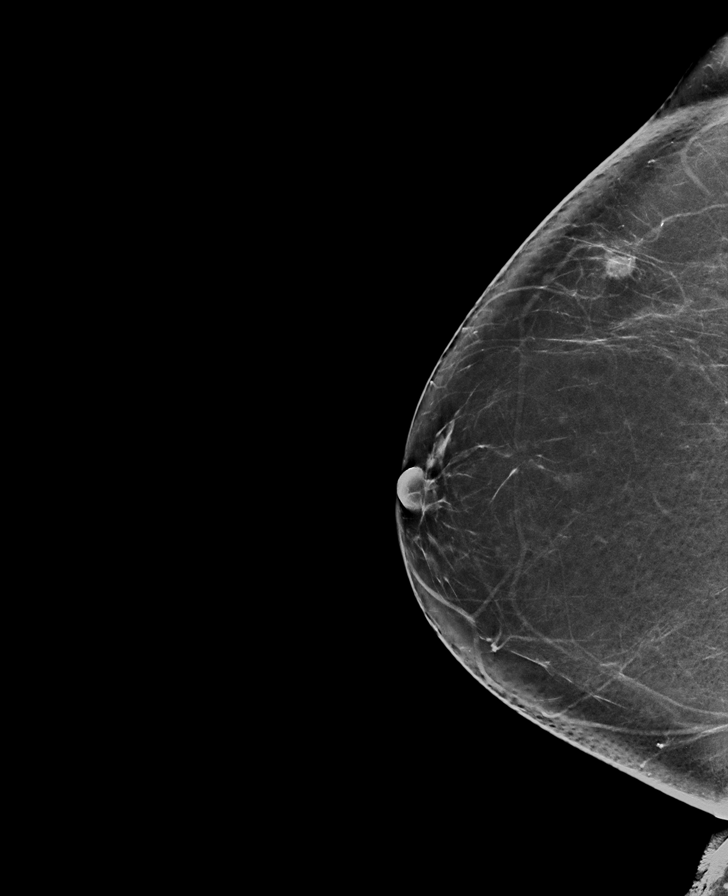

[L MLO tomo · tomo slice 47/92.0]
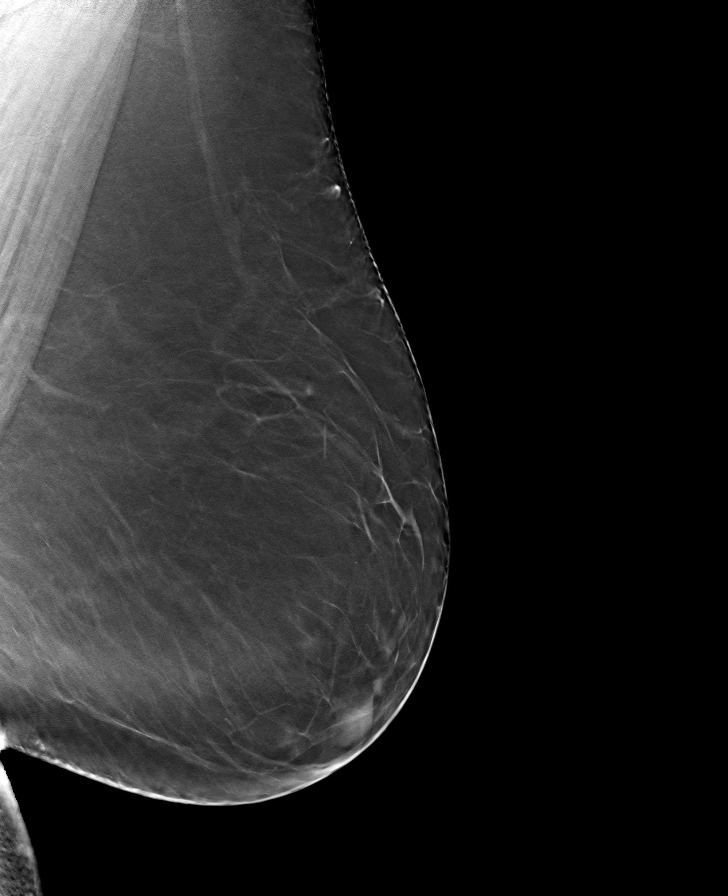

[R MLO tomo · tomo slice 43/85.0]
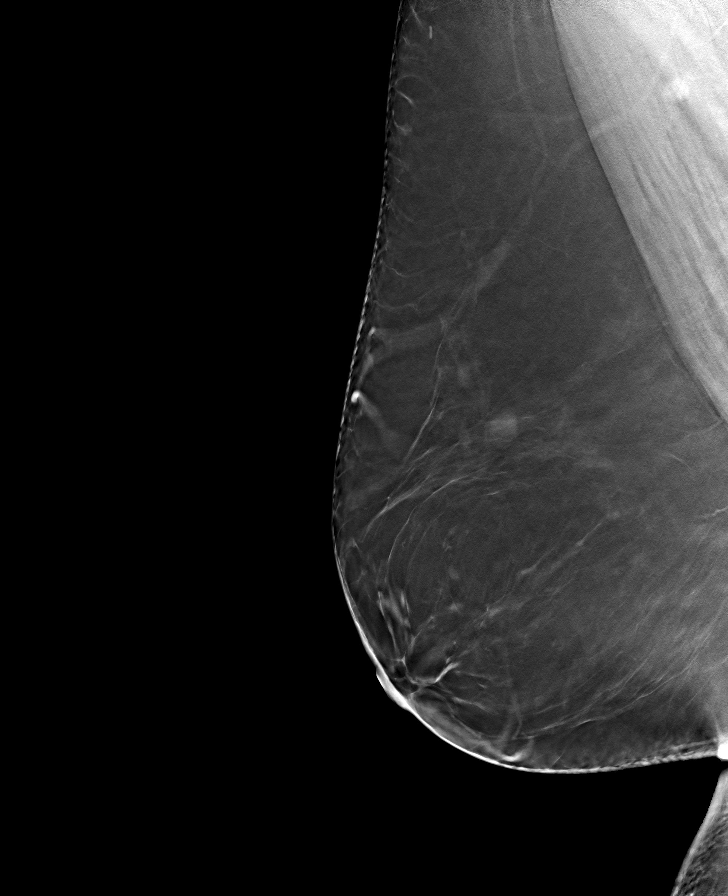

[L CC tomo · tomo slice 41/81.0]
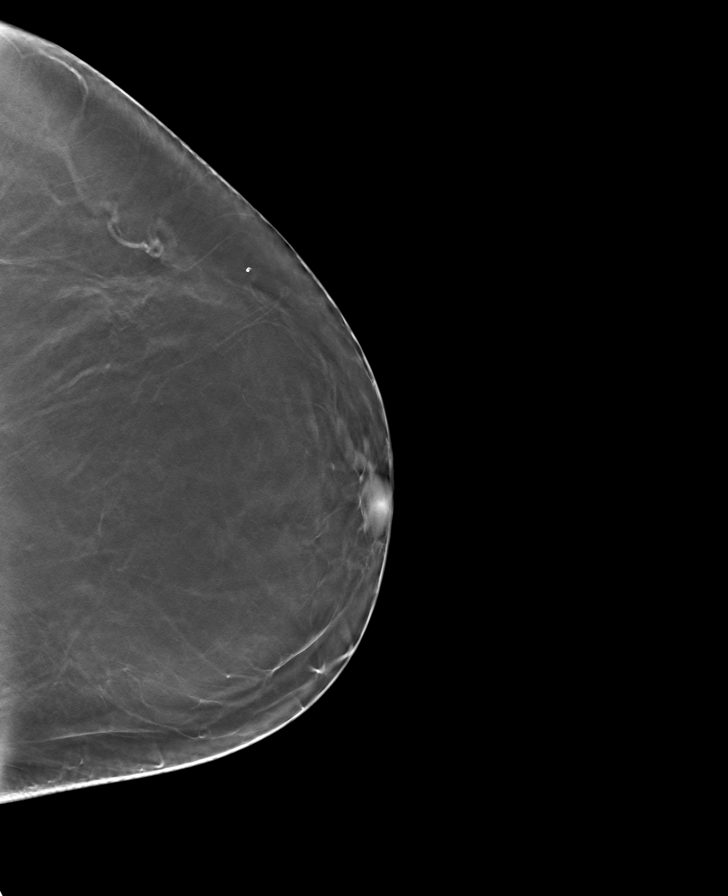

[R CC tomo · tomo slice 38/75.0]
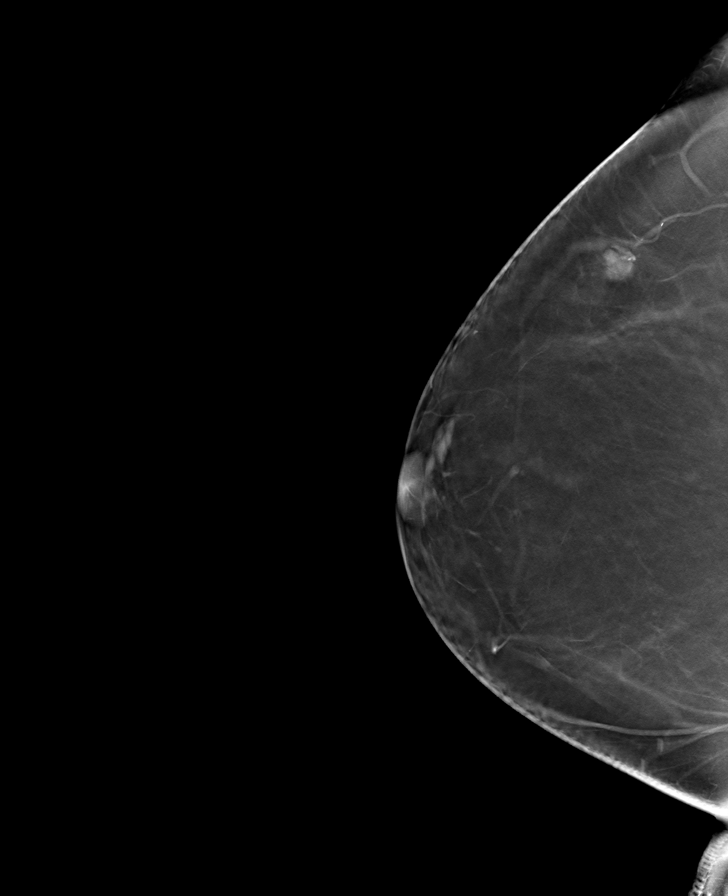

[8 of 24 positions shown; findings below may reference images not displayed]

FINDINGS: In the right breast, a possible mass warrants further evaluation. In
the left breast, no findings suspicious for malignancy. Images were
processed with CAD.
IMPRESSION: Further evaluation is suggested for possible mass in the right
breast.

RECOMMENDATION:
Ultrasound of the right breast. (Code:[id number]-[id number])

The patient will be contacted regarding the findings, and additional
imaging will be scheduled.

BI-RADS CATEGORY  0: Incomplete. Need additional imaging evaluation
and/or prior mammograms for comparison.

## 2021-04-05 DIAGNOSIS — L237 Allergic contact dermatitis due to plants, except food: Secondary | ICD-10-CM | POA: Diagnosis not present

## 2021-04-05 DIAGNOSIS — R7303 Prediabetes: Secondary | ICD-10-CM | POA: Diagnosis not present

## 2021-04-05 DIAGNOSIS — I10 Essential (primary) hypertension: Secondary | ICD-10-CM | POA: Diagnosis not present

## 2021-04-05 DIAGNOSIS — Z23 Encounter for immunization: Secondary | ICD-10-CM | POA: Diagnosis not present

## 2021-08-06 DIAGNOSIS — I10 Essential (primary) hypertension: Secondary | ICD-10-CM | POA: Diagnosis not present

## 2021-08-06 DIAGNOSIS — R7303 Prediabetes: Secondary | ICD-10-CM | POA: Diagnosis not present

## 2021-08-06 DIAGNOSIS — E785 Hyperlipidemia, unspecified: Secondary | ICD-10-CM | POA: Diagnosis not present

## 2021-08-06 DIAGNOSIS — E559 Vitamin D deficiency, unspecified: Secondary | ICD-10-CM | POA: Diagnosis not present

## 2021-10-04 DIAGNOSIS — B399 Histoplasmosis, unspecified: Secondary | ICD-10-CM | POA: Diagnosis not present

## 2021-10-04 DIAGNOSIS — H43393 Other vitreous opacities, bilateral: Secondary | ICD-10-CM | POA: Diagnosis not present

## 2021-10-04 DIAGNOSIS — H31003 Unspecified chorioretinal scars, bilateral: Secondary | ICD-10-CM | POA: Diagnosis not present

## 2021-10-04 DIAGNOSIS — H2513 Age-related nuclear cataract, bilateral: Secondary | ICD-10-CM | POA: Diagnosis not present

## 2021-12-03 DIAGNOSIS — E559 Vitamin D deficiency, unspecified: Secondary | ICD-10-CM | POA: Diagnosis not present

## 2021-12-03 DIAGNOSIS — E785 Hyperlipidemia, unspecified: Secondary | ICD-10-CM | POA: Diagnosis not present

## 2021-12-03 DIAGNOSIS — I10 Essential (primary) hypertension: Secondary | ICD-10-CM | POA: Diagnosis not present

## 2021-12-03 DIAGNOSIS — R7303 Prediabetes: Secondary | ICD-10-CM | POA: Diagnosis not present

## 2021-12-03 DIAGNOSIS — E039 Hypothyroidism, unspecified: Secondary | ICD-10-CM | POA: Diagnosis not present

## 2021-12-03 DIAGNOSIS — L209 Atopic dermatitis, unspecified: Secondary | ICD-10-CM | POA: Diagnosis not present

## 2021-12-03 DIAGNOSIS — R946 Abnormal results of thyroid function studies: Secondary | ICD-10-CM | POA: Diagnosis not present

## 2022-04-05 DIAGNOSIS — E559 Vitamin D deficiency, unspecified: Secondary | ICD-10-CM | POA: Diagnosis not present

## 2022-04-05 DIAGNOSIS — Z0000 Encounter for general adult medical examination without abnormal findings: Secondary | ICD-10-CM | POA: Diagnosis not present

## 2022-04-05 DIAGNOSIS — R7303 Prediabetes: Secondary | ICD-10-CM | POA: Diagnosis not present

## 2022-04-05 DIAGNOSIS — E785 Hyperlipidemia, unspecified: Secondary | ICD-10-CM | POA: Diagnosis not present

## 2022-04-05 DIAGNOSIS — I10 Essential (primary) hypertension: Secondary | ICD-10-CM | POA: Diagnosis not present

## 2022-04-05 DIAGNOSIS — E039 Hypothyroidism, unspecified: Secondary | ICD-10-CM | POA: Diagnosis not present

## 2022-04-05 DIAGNOSIS — Z1211 Encounter for screening for malignant neoplasm of colon: Secondary | ICD-10-CM | POA: Diagnosis not present

## 2022-04-05 DIAGNOSIS — Z1231 Encounter for screening mammogram for malignant neoplasm of breast: Secondary | ICD-10-CM | POA: Diagnosis not present

## 2022-04-05 DIAGNOSIS — Z23 Encounter for immunization: Secondary | ICD-10-CM | POA: Diagnosis not present

## 2022-06-05 ENCOUNTER — Emergency Department: Payer: Medicare Other

## 2022-06-05 ENCOUNTER — Other Ambulatory Visit: Payer: Self-pay

## 2022-06-05 ENCOUNTER — Emergency Department
Admission: EM | Admit: 2022-06-05 | Discharge: 2022-06-05 | Disposition: A | Discharge status: Home / Self Care | Payer: Medicare Other | Attending: Emergency Medicine | Admitting: Emergency Medicine

## 2022-06-05 DIAGNOSIS — K760 Fatty (change of) liver, not elsewhere classified: Secondary | ICD-10-CM | POA: Diagnosis not present

## 2022-06-05 DIAGNOSIS — N3000 Acute cystitis without hematuria: Secondary | ICD-10-CM | POA: Insufficient documentation

## 2022-06-05 DIAGNOSIS — K449 Diaphragmatic hernia without obstruction or gangrene: Secondary | ICD-10-CM | POA: Diagnosis not present

## 2022-06-05 DIAGNOSIS — N201 Calculus of ureter: Secondary | ICD-10-CM

## 2022-06-05 DIAGNOSIS — N132 Hydronephrosis with renal and ureteral calculous obstruction: Secondary | ICD-10-CM | POA: Diagnosis not present

## 2022-06-05 DIAGNOSIS — I10 Essential (primary) hypertension: Secondary | ICD-10-CM | POA: Insufficient documentation

## 2022-06-05 DIAGNOSIS — R109 Unspecified abdominal pain: Secondary | ICD-10-CM | POA: Diagnosis not present

## 2022-06-05 DIAGNOSIS — R079 Chest pain, unspecified: Secondary | ICD-10-CM | POA: Diagnosis not present

## 2022-06-05 DIAGNOSIS — N200 Calculus of kidney: Secondary | ICD-10-CM | POA: Diagnosis not present

## 2022-06-05 LAB — CBC
HCT: 41.8 % (ref 36.0–46.0)
Hemoglobin: 13.5 g/dL (ref 12.0–15.0)
MCH: 27.6 pg (ref 26.0–34.0)
MCHC: 32.3 g/dL (ref 30.0–36.0)
MCV: 85.3 fL (ref 80.0–100.0)
Platelets: 234 10*3/uL (ref 150–400)
RBC: 4.9 MIL/uL (ref 3.87–5.11)
RDW: 13.6 % (ref 11.5–15.5)
WBC: 7.8 10*3/uL (ref 4.0–10.5)
nRBC: 0 % (ref 0.0–0.2)

## 2022-06-05 LAB — URINALYSIS, ROUTINE W REFLEX MICROSCOPIC
Bilirubin Urine: NEGATIVE
Glucose, UA: NEGATIVE mg/dL
Ketones, ur: 5 mg/dL — AB
Nitrite: NEGATIVE
Protein, ur: 100 mg/dL — AB
RBC / HPF: >50 RBC/hpf — ABNORMAL HIGH (ref 0–5)
Specific Gravity, Urine: 1.026 (ref 1.005–1.030)
Squamous Epithelial / HPF: >50 — ABNORMAL HIGH (ref 0–5)
WBC, UA: >50 WBC/hpf — ABNORMAL HIGH (ref 0–5)
pH: 5 (ref 5.0–8.0)

## 2022-06-05 LAB — BASIC METABOLIC PANEL
Anion gap: 13 (ref 5–15)
BUN: 16 mg/dL (ref 8–23)
CO2: 21 mmol/L — ABNORMAL LOW (ref 22–32)
Calcium: 9.4 mg/dL (ref 8.9–10.3)
Chloride: 103 mmol/L (ref 98–111)
Creatinine, Ser: 0.94 mg/dL (ref 0.44–1.00)
GFR, Estimated: >60 mL/min (ref >60)
Glucose, Bld: 168 mg/dL — ABNORMAL HIGH (ref 70–99)
Potassium: 3.6 mmol/L (ref 3.5–5.1)
Sodium: 137 mmol/L (ref 135–145)

## 2022-06-05 LAB — TROPONIN I (HIGH SENSITIVITY): Troponin I (High Sensitivity): 3 ng/L (ref <18)

## 2022-06-05 MED ORDER — SODIUM CHLORIDE 0.9 % IV BOLUS
500.0000 mL | Freq: Once | INTRAVENOUS | Status: AC
  Administered 2022-06-05: 500 mL via INTRAVENOUS

## 2022-06-05 MED ORDER — ONDANSETRON HCL 4 MG/2ML IJ SOLN
4.0000 mg | Freq: Once | INTRAMUSCULAR | Status: AC
  Administered 2022-06-05: 4 mg via INTRAVENOUS
  Filled 2022-06-05: qty 2

## 2022-06-05 MED ORDER — TAMSULOSIN HCL 0.4 MG PO CAPS: 0.4000 mg | ORAL_CAPSULE | Freq: Every day | ORAL | 0 refills | Status: DC

## 2022-06-05 MED ORDER — MORPHINE SULFATE (PF) 4 MG/ML IV SOLN
4.0000 mg | Freq: Once | INTRAVENOUS | Status: AC
  Administered 2022-06-05: 4 mg via INTRAVENOUS
  Filled 2022-06-05: qty 1

## 2022-06-05 MED ORDER — OXYCODONE-ACETAMINOPHEN 7.5-325 MG PO TABS: 1.0000 | ORAL_TABLET | Freq: Four times a day (QID) | ORAL | 0 refills | Status: AC | PRN

## 2022-06-05 MED ORDER — FENTANYL CITRATE PF 50 MCG/ML IJ SOSY
50.0000 ug | PREFILLED_SYRINGE | Freq: Once | INTRAMUSCULAR | Status: AC
  Administered 2022-06-05: 50 ug via INTRAVENOUS
  Filled 2022-06-05: qty 1

## 2022-06-05 MED ORDER — KETOROLAC TROMETHAMINE 30 MG/ML IJ SOLN
15.0000 mg | Freq: Once | INTRAMUSCULAR | Status: AC
  Administered 2022-06-05: 15 mg via INTRAVENOUS
  Filled 2022-06-05: qty 1

## 2022-06-05 MED ORDER — CEPHALEXIN 500 MG PO CAPS: 500.0000 mg | ORAL_CAPSULE | Freq: Three times a day (TID) | ORAL | 0 refills | Status: AC

## 2022-06-05 NOTE — Discharge Instructions (Addendum)
Please take the antibiotic as prescribed and until finished.  Follow-up with your primary care provider or the urologist if you are not improving over the week.  Return to the emergency department if symptoms change or worsen if unable to schedule an appointment.

## 2022-06-05 NOTE — ED Triage Notes (Signed)
Pt states left sided flank pain that started this morning. Pt states it radiates to her back.

## 2022-06-05 NOTE — ED Provider Notes (Signed)
[facility] Provider Note    Event Date/Time   First MD Initiated Contact with Patient 06/05/22 1110     (approximate)   History   Flank Pain (left)   HPI  Natalie Cuevas is a 66 y.o. female with history of hypertension and remaining as listed in the EMR presents to the emergency department for treatment and evaluation of acute onset left flank pain that started this morning.  She has never had pain like this before.  Pain radiates into the left abdomen as well.  She is also experiencing nausea but has not vomited.  No known fever.      Physical Exam   Triage Vital Signs: ED Triage Vitals  Enc Vitals Group     BP 06/05/22 0900 (!) 131/113     Pulse Rate 06/05/22 0900 (!) 53     Resp 06/05/22 0900 (!) 26     Temp 06/05/22 0900 97.7 F (36.5 C)     Temp src --      SpO2 06/05/22 0900 96 %     Weight 06/05/22 0901 265 lb (120.2 kg)     Height 06/05/22 0901 5\' 6"  (1.676 m)     Head Circumference --      Peak Flow --      Pain Score 06/05/22 0901 10     Pain Loc --      Pain Edu? --      Excl. in GC? --     Most recent vital signs: Vitals:   06/05/22 0900 06/05/22 1458  BP: (!) 131/113 (!) 130/92  Pulse: (!) 53 66  Resp: (!) 26 (!) 22  Temp: 97.7 F (36.5 C) 97.7 F (36.5 C)  SpO2: 96% 96%     General: Awake, no distress.  CV:  Good peripheral perfusion.  Resp:  Normal effort.  Abd:  No distention.  Other:  Left CVA tenderness.   ED Results / Procedures / Treatments   Labs (all labs ordered are listed, but only abnormal results are displayed) Labs Reviewed  BASIC METABOLIC PANEL - Abnormal; Notable for the following components:      Result Value   CO2 21 (*)    Glucose, Bld 168 (*)    All other components within normal limits  URINALYSIS, ROUTINE W REFLEX MICROSCOPIC - Abnormal; Notable for the following components:   Color, Urine YELLOW (*)    APPearance TURBID (*)    Hgb urine dipstick LARGE (*)    Ketones, ur 5 (*)     Protein, ur 100 (*)    Leukocytes,Ua MODERATE (*)    RBC / HPF >50 (*)    WBC, UA >50 (*)    Bacteria, UA MANY (*)    Squamous Epithelial / LPF >50 (*)    All other components within normal limits  CBC  TROPONIN I (HIGH SENSITIVITY)     EKG  Not indicated   RADIOLOGY  CT renal stone study shows 3 mm left ureteral calculus above the UVJ.   PROCEDURES:  Critical Care performed: No  Procedures   MEDICATIONS ORDERED IN ED: Medications  sodium chloride 0.9 % bolus 500 mL (0 mLs Intravenous Stopped 06/05/22 1249)  ondansetron (ZOFRAN) injection 4 mg (4 mg Intravenous Given 06/05/22 1203)  fentaNYL (SUBLIMAZE) injection 50 mcg (50 mcg Intravenous Given 06/05/22 1203)  morphine (PF) 4 MG/ML injection 4 mg (4 mg Intravenous Given 06/05/22 1246)  ketorolac (TORADOL) 30 MG/ML injection 15 mg (15 mg Intravenous  Given 06/05/22 1449)     IMPRESSION / MDM / ASSESSMENT AND PLAN / ED COURSE  I reviewed the triage vital signs and the nursing notes.                              Differential diagnosis includes, but is not limited to, kidney stone, pyelonephritis, musculoskeletal pain.  Patient's presentation is most consistent with acute illness / injury with system symptoms.  66 year old female presenting to the emergency department for treatment evaluation of left flank pain that radiates into the left lower quadrant.  See HPI for further details.  On exam, she does have some CVA tenderness.  I suspect that she has a kidney stone.  Plan will be to get labs, give pain medication, collect urinalysis, and get a CT renal stone study.  CT scan shows a 3 mm stone above the UVJ on the left.  Results discussed with the patient.  Patient's pain not well-controlled at this time and a second dose of medication has been ordered.   Clinical Course as of 06/05/22 1903  Sun Jun 05, 2022  1448 Urinalysis with moderate leukocytes, greater than 50 white blood cells, many bacteria but also  greater than 50 squamous cells.  Plan will be to treat her with Keflex.  She will also be given prescriptions for Flomax and Percocet.  She will be encouraged to follow-up with her primary care provider or the urologist if not improving over the week.  ER return precautions discussed. [CT]    Clinical Course User Index [CT] Shatarra Wehling B, FNP     FINAL CLINICAL IMPRESSION(S) / ED DIAGNOSES   Final diagnoses:  Ureterolithiasis  Acute cystitis without hematuria     Rx / DC Orders   ED Discharge Orders          Ordered    cephALEXin (KEFLEX) 500 MG capsule  3 times daily        06/05/22 1446    tamsulosin (FLOMAX) 0.4 MG CAPS capsule  Daily        06/05/22 1446    oxyCODONE-acetaminophen (PERCOCET) 7.5-325 MG tablet  Every 6 hours PRN        06/05/22 1446             Note:  This document was prepared using Dragon voice recognition software and may include unintentional dictation errors.   Chinita Pester, FNP 06/05/22 1903    Georga Hacking, MD 06/05/22 1925

## 2022-06-09 ENCOUNTER — Telehealth: Payer: Self-pay

## 2022-06-09 NOTE — Telephone Encounter (Signed)
     Patient  visit on 12/17  at [place]   Have you been able to follow up with your primary care physician? Not at this time  The patient was or was not able to obtain any needed medicine or equipment. Yes   Are there diet recommendations that you are having difficulty following? NA  Patient expresses understanding of discharge instructions and education provided has no other needs at this time. Yes     Lenard Forth [facility] Guide, [facility], Care Management  [phone] [address] [place], [place], [place] [address] Phone: [phone] Email: [email or web address]

## 2022-08-05 ENCOUNTER — Encounter: Payer: Self-pay | Admitting: Family

## 2022-08-05 ENCOUNTER — Ambulatory Visit (INDEPENDENT_AMBULATORY_CARE_PROVIDER_SITE_OTHER): Payer: Medicare Other | Admitting: Family

## 2022-08-05 VITALS — BP 124/78 | HR 65 | Ht 64.0 in | Wt 265.0 lb

## 2022-08-05 DIAGNOSIS — R7303 Prediabetes: Secondary | ICD-10-CM

## 2022-08-05 DIAGNOSIS — E559 Vitamin D deficiency, unspecified: Secondary | ICD-10-CM

## 2022-08-05 DIAGNOSIS — I10 Essential (primary) hypertension: Secondary | ICD-10-CM

## 2022-08-05 DIAGNOSIS — E782 Mixed hyperlipidemia: Secondary | ICD-10-CM

## 2022-08-05 DIAGNOSIS — N951 Menopausal and female climacteric states: Secondary | ICD-10-CM

## 2022-08-05 DIAGNOSIS — Z1211 Encounter for screening for malignant neoplasm of colon: Secondary | ICD-10-CM

## 2022-08-05 NOTE — Progress Notes (Signed)
Established Patient Office Visit  Subjective:  Patient ID: Natalie Cuevas, female    DOB: Oct 26, 1955  Age: 67 y.o. MRN: [id number]  Chief Complaint  Patient presents with   Follow-up    3 month follow up    Shoulder Pain  The pain is present in the left arm and left shoulder. This is a new problem. The current episode started 1 to 4 weeks ago. There has been a history of trauma. The problem occurs constantly. The problem has been unchanged. The quality of the pain is described as aching and dull. The pain is at a severity of 4/10. The pain is moderate. Associated symptoms include a limited range of motion and stiffness. The symptoms are aggravated by activity. She has tried acetaminophen, NSAIDS, OTC ointments, rest, heat and cold for the symptoms. The treatment provided mild relief. Family history does not include gout or rheumatoid arthritis. There is no history of diabetes, gout, osteoarthritis or rheumatoid arthritis.  Hypertension This is a chronic problem. The current episode started more than 1 year ago. The problem is unchanged. The problem is controlled. There are no associated agents to hypertension. Risk factors for coronary artery disease include dyslipidemia, obesity and post-menopausal state. Past treatments include ACE inhibitors. The current treatment provides significant improvement. There are no compliance problems.      Past Medical History:  Diagnosis Date   Hypertension    Tooth abscess 12/01/2014    Social History   Socioeconomic History   Marital status: Single    Spouse name: Not on file   Number of children: Not on file   Years of education: Not on file   Highest education level: Not on file  Occupational History   Not on file  Tobacco Use   Smoking status: Former    Packs/day: 0.25    Types: Cigarettes   Smokeless tobacco: Never  Vaping Use   Vaping Use: Never used  Substance and Sexual Activity   Alcohol use: No   Drug use: No    Types:  "Crack" cocaine   Sexual activity: Not Currently  Other Topics Concern   Not on file  Social History Narrative   Not on file   Social Determinants of Health   Financial Resource Strain: Not on file  Food Insecurity: Not on file  Transportation Needs: Not on file  Physical Activity: Not on file  Stress: Not on file  Social Connections: Not on file  Intimate Partner Violence: Not on file    Family History  Problem Relation Age of Onset   Hypertension Mother    Huntington's disease Father    Breast cancer Neg Hx     Allergies  Allergen Reactions   Codeine     Review of Systems  Musculoskeletal:  Positive for joint pain, myalgias and stiffness. Negative for gout.  All other systems reviewed and are negative.      Objective:   BP 124/78   Pulse 65   Ht 5' 4"$  (1.626 m)   Wt 265 lb (120.2 kg)   SpO2 97%   BMI 45.49 kg/m   Vitals:   08/05/22 0932  BP: 124/78  Pulse: 65  Height: 5' 4"$  (1.626 m)  Weight: 265 lb (120.2 kg)  SpO2: 97%  BMI (Calculated): 45.46    Physical Exam Vitals and nursing note reviewed.  Constitutional:      General: She is awake.     Appearance: Normal appearance. She is well-developed, well-groomed and overweight.  HENT:  Head: Normocephalic and atraumatic.     Nose: Nose normal.  Eyes:     General: Lids are normal. Vision grossly intact.  Cardiovascular:     Rate and Rhythm: Normal rate and regular rhythm.     Heart sounds: Normal heart sounds.  Pulmonary:     Effort: Pulmonary effort is normal.     Breath sounds: Normal breath sounds.  Musculoskeletal:     Right shoulder: Normal.     Left shoulder: Tenderness present. Decreased range of motion.     Left upper arm: Tenderness (Tender to touch, patient hurt from BP cuff.) present.  Neurological:     General: No focal deficit present.     Mental Status: She is alert and oriented to person, place, and time.  Psychiatric:        Behavior: Behavior is cooperative.       No results found for any visits on 08/05/22.  Recent Results (from the past 2160 hour(s))  Basic metabolic panel     Status: Abnormal   Collection Time: 06/05/22  9:20 AM  Result Value Ref Range   Sodium 137 135 - 145 mmol/L   Potassium 3.6 3.5 - 5.1 mmol/L   Chloride 103 98 - 111 mmol/L   CO2 21 (L) 22 - 32 mmol/L   Glucose, Bld 168 (H) 70 - 99 mg/dL    Comment: Glucose reference range applies only to samples taken after fasting for at least 8 hours.   BUN 16 8 - 23 mg/dL   Creatinine, Ser 0.94 0.44 - 1.00 mg/dL   Calcium 9.4 8.9 - 10.3 mg/dL   GFR, Estimated >60 >60 mL/min    Comment: (NOTE) Calculated using the CKD-EPI Creatinine Equation (2021)    Anion gap 13 5 - 15    Comment: Performed at [facility], [address] [place], [place] [address]  CBC     Status: None   Collection Time: 06/05/22  9:20 AM  Result Value Ref Range   WBC 7.8 4.0 - 10.5 K/uL   RBC 4.90 3.87 - 5.11 MIL/uL   Hemoglobin 13.5 12.0 - 15.0 g/dL   HCT 41.8 36.0 - 46.0 %   MCV 85.3 80.0 - 100.0 fL   MCH 27.6 26.0 - 34.0 pg   MCHC 32.3 30.0 - 36.0 g/dL   RDW 13.6 11.5 - 15.5 %   Platelets 234 150 - 400 K/uL   nRBC 0.0 0.0 - 0.2 %    Comment: Performed at [facility], [place], [place] [address]  Troponin I (High Sensitivity)     Status: None   Collection Time: 06/05/22  9:20 AM  Result Value Ref Range   Troponin I (High Sensitivity) 3 <18 ng/L    Comment: (NOTE) Elevated high sensitivity troponin I (hsTnI) values and significant  changes across serial measurements may suggest ACS but many other  chronic and acute conditions are known to elevate hsTnI results.  Refer to the "Links" section for chest pain algorithms and additional  guidance. Performed at [facility], [address] [place], [place] [address]   Urinalysis, Routine w reflex microscopic Urine, Clean Catch     Status: Abnormal   Collection Time: 06/05/22  2:10 PM  Result  Value Ref Range   Color, Urine YELLOW (A) YELLOW   APPearance TURBID (A) CLEAR   Specific Gravity, Urine 1.026 1.005 - 1.030   pH 5.0 5.0 - 8.0   Glucose, UA NEGATIVE NEGATIVE mg/dL  Hgb urine dipstick LARGE (A) NEGATIVE   Bilirubin Urine NEGATIVE NEGATIVE   Ketones, ur 5 (A) NEGATIVE mg/dL   Protein, ur 100 (A) NEGATIVE mg/dL   Nitrite NEGATIVE NEGATIVE   Leukocytes,Ua MODERATE (A) NEGATIVE   RBC / HPF >50 (H) 0 - 5 RBC/hpf   WBC, UA >50 (H) 0 - 5 WBC/hpf   Bacteria, UA MANY (A) NONE SEEN   Squamous Epithelial / HPF >50 (H) 0 - 5   Mucus PRESENT     Comment: Performed at [facility], [address] [place], [place] [address]      Assessment & Plan:   Problem List Items Addressed This Visit     Hypertension   Relevant Medications   lisinopril (ZESTRIL) 20 MG tablet   rosuvastatin (CRESTOR) 10 MG tablet   Other Relevant Orders   CBC With Differential   CMP14+EGFR   Prediabetes   Relevant Orders   Hemoglobin A1c   Hyperlipidemia, mixed - Primary   Relevant Medications   lisinopril (ZESTRIL) 20 MG tablet   rosuvastatin (CRESTOR) 10 MG tablet   Other Relevant Orders   Lipid panel   Vitamin D deficiency   Relevant Orders   VITAMIN D 25 Hydroxy (Vit-D Deficiency, Fractures)   Other Visit Diagnoses     Symptomatic menopausal or female climacteric states       Relevant Orders   DG Bone Density   Screening for malignant neoplasm of colon       Relevant Orders   Cologuard       Return in about 3 months (around 11/03/2022).   Total time spent: 30 minutes  Mechele Claude, FNP  08/05/2022

## 2022-08-05 NOTE — Patient Instructions (Signed)
See attached paperwork for exercises for shoulder/arm.

## 2022-08-06 LAB — LIPID PANEL
Chol/HDL Ratio: 3.2 ratio (ref 0.0–4.4)
Cholesterol, Total: 146 mg/dL (ref 100–199)
HDL: 46 mg/dL (ref >39)
LDL Chol Calc (NIH): 80 mg/dL (ref 0–99)
Triglycerides: 110 mg/dL (ref 0–149)
VLDL Cholesterol Cal: 20 mg/dL (ref 5–40)

## 2022-08-06 LAB — CBC WITH DIFFERENTIAL
Basophils Absolute: 0 10*3/uL (ref 0.0–0.2)
Basos: 0 %
EOS (ABSOLUTE): 0.2 10*3/uL (ref 0.0–0.4)
Eos: 2 %
Hematocrit: 40.8 % (ref 34.0–46.6)
Hemoglobin: 13.4 g/dL (ref 11.1–15.9)
Immature Grans (Abs): 0 10*3/uL (ref 0.0–0.1)
Immature Granulocytes: 0 %
Lymphocytes Absolute: 1.8 10*3/uL (ref 0.7–3.1)
Lymphs: 23 %
MCH: 27.3 pg (ref 26.6–33.0)
MCHC: 32.8 g/dL (ref 31.5–35.7)
MCV: 83 fL (ref 79–97)
Monocytes Absolute: 0.5 10*3/uL (ref 0.1–0.9)
Monocytes: 6 %
Neutrophils Absolute: 5.1 10*3/uL (ref 1.4–7.0)
Neutrophils: 69 %
RBC: 4.9 x10E6/uL (ref 3.77–5.28)
RDW: 13.7 % (ref 11.7–15.4)
WBC: 7.6 10*3/uL (ref 3.4–10.8)

## 2022-08-06 LAB — CMP14+EGFR
ALT: 18 IU/L (ref 0–32)
AST: 20 IU/L (ref 0–40)
Albumin/Globulin Ratio: 1.3 (ref 1.2–2.2)
Albumin: 4 g/dL (ref 3.9–4.9)
Alkaline Phosphatase: 86 IU/L (ref 44–121)
BUN/Creatinine Ratio: 15 (ref 12–28)
BUN: 12 mg/dL (ref 8–27)
Bilirubin Total: 0.7 mg/dL (ref 0.0–1.2)
CO2: 25 mmol/L (ref 20–29)
Calcium: 9.7 mg/dL (ref 8.7–10.3)
Chloride: 101 mmol/L (ref 96–106)
Creatinine, Ser: 0.8 mg/dL (ref 0.57–1.00)
Globulin, Total: 3.1 g/dL (ref 1.5–4.5)
Glucose: 90 mg/dL (ref 70–99)
Potassium: 4.5 mmol/L (ref 3.5–5.2)
Sodium: 142 mmol/L (ref 134–144)
Total Protein: 7.1 g/dL (ref 6.0–8.5)
eGFR: 80 mL/min/{1.73_m2} (ref >59)

## 2022-08-06 LAB — HEMOGLOBIN A1C
Est. average glucose Bld gHb Est-mCnc: 131 mg/dL
Hgb A1c MFr Bld: 6.2 % — ABNORMAL HIGH (ref 4.8–5.6)

## 2022-08-06 LAB — VITAMIN D 25 HYDROXY (VIT D DEFICIENCY, FRACTURES): Vit D, 25-Hydroxy: 50.6 ng/mL (ref 30.0–100.0)

## 2022-08-10 ENCOUNTER — Encounter: Payer: Self-pay | Admitting: Family

## 2022-08-19 ENCOUNTER — Other Ambulatory Visit: Payer: Self-pay | Admitting: Family

## 2022-09-27 ENCOUNTER — Ambulatory Visit (INDEPENDENT_AMBULATORY_CARE_PROVIDER_SITE_OTHER): Payer: Medicare Other

## 2022-09-27 DIAGNOSIS — M8589 Other specified disorders of bone density and structure, multiple sites: Secondary | ICD-10-CM | POA: Diagnosis not present

## 2022-09-27 DIAGNOSIS — N951 Menopausal and female climacteric states: Secondary | ICD-10-CM | POA: Diagnosis not present

## 2022-09-28 ENCOUNTER — Encounter: Payer: Self-pay | Admitting: Family

## 2022-11-03 ENCOUNTER — Ambulatory Visit (INDEPENDENT_AMBULATORY_CARE_PROVIDER_SITE_OTHER): Payer: Medicare Other | Admitting: Family

## 2022-11-03 ENCOUNTER — Ambulatory Visit (INDEPENDENT_AMBULATORY_CARE_PROVIDER_SITE_OTHER): Payer: Medicare Other

## 2022-11-03 ENCOUNTER — Encounter: Payer: Self-pay | Admitting: Family

## 2022-11-03 VITALS — BP 130/70 | HR 80 | Ht 65.0 in | Wt 262.0 lb

## 2022-11-03 DIAGNOSIS — Z1211 Encounter for screening for malignant neoplasm of colon: Secondary | ICD-10-CM | POA: Diagnosis not present

## 2022-11-03 DIAGNOSIS — R7303 Prediabetes: Secondary | ICD-10-CM

## 2022-11-03 DIAGNOSIS — R5383 Other fatigue: Secondary | ICD-10-CM | POA: Diagnosis not present

## 2022-11-03 DIAGNOSIS — E782 Mixed hyperlipidemia: Secondary | ICD-10-CM

## 2022-11-03 DIAGNOSIS — G8929 Other chronic pain: Secondary | ICD-10-CM

## 2022-11-03 DIAGNOSIS — I10 Essential (primary) hypertension: Secondary | ICD-10-CM | POA: Diagnosis not present

## 2022-11-03 DIAGNOSIS — E538 Deficiency of other specified B group vitamins: Secondary | ICD-10-CM | POA: Diagnosis not present

## 2022-11-03 DIAGNOSIS — E559 Vitamin D deficiency, unspecified: Secondary | ICD-10-CM | POA: Diagnosis not present

## 2022-11-03 DIAGNOSIS — M25512 Pain in left shoulder: Secondary | ICD-10-CM | POA: Diagnosis not present

## 2022-11-03 NOTE — Assessment & Plan Note (Signed)
Patient educated on foods that contain carbohydrates and the need to decrease intake.  We discussed prediabetes, and what it means and the need for strict dietary control to prevent progression to type 2 diabetes.  Advised to decrease intake of sugary drinks, including sodas, sweet tea, and some juices, and of starch and sugar heavy foods (ie., potatoes, rice, bread, pasta, desserts). She verbalizes understanding and agreement with the changes discussed today.   

## 2022-11-03 NOTE — Progress Notes (Signed)
Established Patient Office Visit  Subjective:  Patient ID: Natalie Cuevas, female    DOB: 02-Feb-1956  Age: 67 y.o. MRN: [id number]  Chief Complaint  Patient presents with   Follow-up    3 month follow up    Patient is here today for her 3 months follow up.  She has been feeling about the same since last appointment.   She does have additional concerns to discuss today.  Her shoulder has been continuing to bother her, despite the treatments we talked about at our previous appointment.   Shoulder Pain  The pain is present in the left shoulder and left arm. This is a chronic problem. The current episode started more than 1 month ago. There has been no history of extremity trauma. The problem occurs intermittently. The problem has been waxing and waning. The quality of the pain is described as sharp. The pain is at a severity of 8/10. The pain is severe. Associated symptoms include a limited range of motion. The symptoms are aggravated by activity. She has tried acetaminophen, NSAIDS, OTC ointments, OTC pain meds, movement, rest, heat and cold for the symptoms. The treatment provided no relief.   Labs are due today. She needs refills.   I have reviewed her active problem list, medication list, allergies, notes from last encounter, lab results for her appointment today.   No other concerns at this time.   Past Medical History:  Diagnosis Date   Hypertension    Tooth abscess 12/01/2014    Past Surgical History:  Procedure Laterality Date   ABDOMINAL HYSTERECTOMY     APPENDECTOMY     CHOLECYSTECTOMY     TONSILLECTOMY      Social History   Socioeconomic History   Marital status: Single    Spouse name: Not on file   Number of children: Not on file   Years of education: Not on file   Highest education level: Not on file  Occupational History   Not on file  Tobacco Use   Smoking status: Former    Packs/day: .25    Types: Cigarettes   Smokeless tobacco: Never  Vaping  Use   Vaping Use: Never used  Substance and Sexual Activity   Alcohol use: No   Drug use: No    Types: "Crack" cocaine   Sexual activity: Not Currently  Other Topics Concern   Not on file  Social History Narrative   Not on file   Social Determinants of Health   Financial Resource Strain: Not on file  Food Insecurity: Not on file  Transportation Needs: Not on file  Physical Activity: Not on file  Stress: Not on file  Social Connections: Not on file  Intimate Partner Violence: Not on file    Family History  Problem Relation Age of Onset   Hypertension Mother    Huntington's disease Father    Breast cancer Neg Hx     Allergies  Allergen Reactions   Codeine     Review of Systems  Musculoskeletal:  Positive for joint pain.  All other systems reviewed and are negative.      Objective:   BP 130/70   Pulse 80   Ht 5\' 5"  (1.651 m)   Wt 262 lb (118.8 kg)   SpO2 97%   BMI 43.60 kg/m   Vitals:   11/03/22 0956  BP: 130/70  Pulse: 80  Height: 5\' 5"  (1.651 m)  Weight: 262 lb (118.8 kg)  SpO2: 97%  BMI (Calculated):  43.6    Physical Exam Vitals and nursing note reviewed.  Constitutional:      Appearance: Normal appearance. She is obese.  HENT:     Head: Normocephalic.  Eyes:     Pupils: Pupils are equal, round, and reactive to light.  Cardiovascular:     Rate and Rhythm: Normal rate and regular rhythm.  Pulmonary:     Effort: Pulmonary effort is normal.  Musculoskeletal:     Right shoulder: Normal.     Left shoulder: Tenderness and crepitus present. Decreased range of motion.  Neurological:     General: No focal deficit present.     Mental Status: She is alert and oriented to person, place, and time. Mental status is at baseline.  Psychiatric:        Mood and Affect: Mood normal.        Behavior: Behavior normal.        Thought Content: Thought content normal.        Judgment: Judgment normal.      No results found for any visits on  11/03/22.  No results found for this or any previous visit (from the past 2160 hour(s)).      Assessment & Plan:   Problem List Items Addressed This Visit       Active Problems   Hypertension   Relevant Orders   CBC With Differential   CMP14+EGFR   Prediabetes   Relevant Orders   CBC With Differential   CMP14+EGFR   Hemoglobin A1c   Hyperlipidemia, mixed   Relevant Orders   Lipid panel   CBC With Differential   CMP14+EGFR   Vitamin D deficiency   Relevant Orders   VITAMIN D 25 Hydroxy (Vit-D Deficiency, Fractures)   CBC With Differential   CMP14+EGFR   Other Visit Diagnoses     Screening for malignant neoplasm of colon    -  Primary   Relevant Orders   Cologuard   CBC With Differential   CMP14+EGFR   B12 deficiency due to diet       Relevant Orders   CBC With Differential   CMP14+EGFR   Vitamin B12   Other fatigue       Relevant Orders   CBC With Differential   CMP14+EGFR   TSH   Chronic left shoulder pain       Relevant Orders   DG Shoulder Left       Return in about 3 months (around 02/03/2023) for F/U.   Total time spent: 30 minutes  Miki Kins, FNP  11/03/2022   This document may have been prepared by [facility] Voice Recognition software and as such may include unintentional dictation errors.

## 2022-11-03 NOTE — Addendum Note (Signed)
Addended by: Grayling Congress on: 11/03/2022 03:05 PM   Modules accepted: Orders

## 2022-11-03 NOTE — Assessment & Plan Note (Signed)
>>  ASSESSMENT AND PLAN FOR PREDIABETES WRITTEN ON 11/03/2022 12:34 PM BY Miki Kins, FNP  Patient educated on foods that contain carbohydrates and the need to decrease intake.  We discussed prediabetes, and what it means and the need for strict dietary control to prevent progression to type 2 diabetes.  Advised to decrease intake of sugary drinks, including sodas, sweet tea, and some juices, and of starch and sugar heavy foods (ie., potatoes, rice, bread, pasta, desserts). She verbalizes understanding and agreement with the changes discussed today.

## 2022-11-03 NOTE — Assessment & Plan Note (Signed)
Blood pressure well controlled with current medications.  Continue current therapy.  Will reassess at follow up.  

## 2022-11-03 NOTE — Assessment & Plan Note (Signed)
Checking labs today.  Will continue supplements as needed.  

## 2022-11-03 NOTE — Assessment & Plan Note (Signed)
Checking labs today.  Continue current therapy for lipid control. Will modify as needed based on labwork results.  

## 2022-11-04 ENCOUNTER — Encounter: Payer: Self-pay | Admitting: Family

## 2022-11-04 LAB — CBC WITH DIFFERENTIAL
Basophils Absolute: 0 10*3/uL (ref 0.0–0.2)
Basos: 0 %
EOS (ABSOLUTE): 0.2 10*3/uL (ref 0.0–0.4)
Eos: 3 %
Hematocrit: 39.6 % (ref 34.0–46.6)
Hemoglobin: 13 g/dL (ref 11.1–15.9)
Immature Grans (Abs): 0 10*3/uL (ref 0.0–0.1)
Immature Granulocytes: 0 %
Lymphocytes Absolute: 1.6 10*3/uL (ref 0.7–3.1)
Lymphs: 25 %
MCH: 28.1 pg (ref 26.6–33.0)
MCHC: 32.8 g/dL (ref 31.5–35.7)
MCV: 86 fL (ref 79–97)
Monocytes Absolute: 0.4 10*3/uL (ref 0.1–0.9)
Monocytes: 6 %
Neutrophils Absolute: 4.2 10*3/uL (ref 1.4–7.0)
Neutrophils: 66 %
RBC: 4.63 x10E6/uL (ref 3.77–5.28)
RDW: 13.5 % (ref 11.7–15.4)
WBC: 6.3 10*3/uL (ref 3.4–10.8)

## 2022-11-04 LAB — CMP14+EGFR
ALT: 19 IU/L (ref 0–32)
AST: 21 IU/L (ref 0–40)
Albumin/Globulin Ratio: 1.4 (ref 1.2–2.2)
Albumin: 4.1 g/dL (ref 3.9–4.9)
Alkaline Phosphatase: 78 IU/L (ref 44–121)
BUN/Creatinine Ratio: 13 (ref 12–28)
BUN: 12 mg/dL (ref 8–27)
Bilirubin Total: 0.6 mg/dL (ref 0.0–1.2)
CO2: 24 mmol/L (ref 20–29)
Calcium: 9.5 mg/dL (ref 8.7–10.3)
Chloride: 103 mmol/L (ref 96–106)
Creatinine, Ser: 0.92 mg/dL (ref 0.57–1.00)
Globulin, Total: 3 g/dL (ref 1.5–4.5)
Glucose: 86 mg/dL (ref 70–99)
Potassium: 4.3 mmol/L (ref 3.5–5.2)
Sodium: 145 mmol/L — ABNORMAL HIGH (ref 134–144)
Total Protein: 7.1 g/dL (ref 6.0–8.5)
eGFR: 68 mL/min/{1.73_m2} (ref >59)

## 2022-11-04 LAB — LIPID PANEL
Chol/HDL Ratio: 2.9 ratio (ref 0.0–4.4)
Cholesterol, Total: 122 mg/dL (ref 100–199)
HDL: 42 mg/dL (ref >39)
LDL Chol Calc (NIH): 61 mg/dL (ref 0–99)
Triglycerides: 102 mg/dL (ref 0–149)
VLDL Cholesterol Cal: 19 mg/dL (ref 5–40)

## 2022-11-04 LAB — HEMOGLOBIN A1C
Est. average glucose Bld gHb Est-mCnc: 128 mg/dL
Hgb A1c MFr Bld: 6.1 % — ABNORMAL HIGH (ref 4.8–5.6)

## 2022-11-04 LAB — VITAMIN D 25 HYDROXY (VIT D DEFICIENCY, FRACTURES): Vit D, 25-Hydroxy: 62.8 ng/mL (ref 30.0–100.0)

## 2022-11-04 LAB — TSH: TSH: 2.98 u[IU]/mL (ref 0.450–4.500)

## 2022-11-04 LAB — VITAMIN B12: Vitamin B-12: 492 pg/mL (ref 232–1245)

## 2022-11-12 ENCOUNTER — Other Ambulatory Visit: Payer: Self-pay | Admitting: Family

## 2022-12-10 ENCOUNTER — Ambulatory Visit
Admission: RE | Admit: 2022-12-10 | Discharge: 2022-12-10 | Disposition: A | Discharge status: Home / Self Care | Payer: Medicare Other | Source: Ambulatory Visit | Attending: Family | Admitting: Family

## 2022-12-10 DIAGNOSIS — G8929 Other chronic pain: Secondary | ICD-10-CM | POA: Diagnosis not present

## 2022-12-10 DIAGNOSIS — M25512 Pain in left shoulder: Secondary | ICD-10-CM | POA: Insufficient documentation

## 2022-12-10 DIAGNOSIS — M19012 Primary osteoarthritis, left shoulder: Secondary | ICD-10-CM | POA: Diagnosis not present

## 2022-12-10 DIAGNOSIS — M7582 Other shoulder lesions, left shoulder: Secondary | ICD-10-CM | POA: Diagnosis not present

## 2022-12-16 ENCOUNTER — Telehealth: Payer: Self-pay

## 2022-12-16 NOTE — Telephone Encounter (Signed)
Patient LM asking about her MRI results she states she had the MRI done last Saturday

## 2022-12-19 ENCOUNTER — Telehealth: Payer: Self-pay

## 2022-12-19 NOTE — Telephone Encounter (Signed)
Patient called again about her MRI

## 2022-12-21 ENCOUNTER — Telehealth: Payer: Self-pay | Admitting: Family

## 2022-12-21 NOTE — Telephone Encounter (Signed)
Patient called in requesting results of her MRI. Please advise.

## 2022-12-27 ENCOUNTER — Telehealth: Payer: Self-pay | Admitting: Family

## 2022-12-27 NOTE — Telephone Encounter (Signed)
Patient called in wanting to know what the plan is for her arm. She is still having pain and difficulty using the arm. What are we going to do next? Should we refer to ortho? Please advise.

## 2022-12-30 ENCOUNTER — Other Ambulatory Visit: Payer: Self-pay | Admitting: Family

## 2022-12-30 DIAGNOSIS — M25519 Pain in unspecified shoulder: Secondary | ICD-10-CM

## 2022-12-30 NOTE — Telephone Encounter (Signed)
See other message

## 2022-12-30 NOTE — Telephone Encounter (Signed)
Referral sent and results sent via message

## 2022-12-30 NOTE — Progress Notes (Signed)
Patient notified

## 2023-02-03 ENCOUNTER — Ambulatory Visit (INDEPENDENT_AMBULATORY_CARE_PROVIDER_SITE_OTHER): Payer: Medicare Other | Admitting: Family

## 2023-02-03 ENCOUNTER — Encounter: Payer: Self-pay | Admitting: Family

## 2023-02-03 DIAGNOSIS — E782 Mixed hyperlipidemia: Secondary | ICD-10-CM

## 2023-02-03 DIAGNOSIS — R7303 Prediabetes: Secondary | ICD-10-CM

## 2023-02-03 DIAGNOSIS — M67814 Other specified disorders of tendon, left shoulder: Secondary | ICD-10-CM | POA: Diagnosis not present

## 2023-02-03 DIAGNOSIS — R5383 Other fatigue: Secondary | ICD-10-CM

## 2023-02-03 DIAGNOSIS — E538 Deficiency of other specified B group vitamins: Secondary | ICD-10-CM | POA: Diagnosis not present

## 2023-02-03 DIAGNOSIS — I10 Essential (primary) hypertension: Secondary | ICD-10-CM | POA: Diagnosis not present

## 2023-02-03 DIAGNOSIS — E559 Vitamin D deficiency, unspecified: Secondary | ICD-10-CM

## 2023-02-03 NOTE — Assessment & Plan Note (Signed)

## 2023-02-03 NOTE — Assessment & Plan Note (Signed)
>>  ASSESSMENT AND PLAN FOR PREDIABETES WRITTEN ON 02/03/2023  3:42 PM BY Tamikka Pilger M, FNP  A1C Continues to be in prediabetic ranges.  Will reassess at follow up after next lab check.  Patient counseled on dietary choices and verbalized understanding.  Patient educated on foods that contain carbohydrates and the need to decrease intake.  We discussed prediabetes, and what it means and the need for strict dietary control to prevent progression to type 2 diabetes.  Advised to decrease intake of sugary drinks, including sodas, sweet tea, and some juices, and of starch and sugar heavy foods (ie., potatoes, rice, bread, pasta, desserts). She verbalizes understanding and agreement with the changes discussed today.

## 2023-02-03 NOTE — Assessment & Plan Note (Signed)
Checking labs today.  Will continue supplements as needed.  

## 2023-02-03 NOTE — Assessment & Plan Note (Signed)
Blood pressure well controlled with current medications.  Continue current therapy.  Will reassess at follow up.  

## 2023-02-03 NOTE — Assessment & Plan Note (Signed)
Checking labs today.  Continue current therapy for lipid control. Will modify as needed based on labwork results.  

## 2023-02-03 NOTE — Assessment & Plan Note (Signed)
Sending referral to physical therapy.  Will reassess pt condition at follow up.

## 2023-02-03 NOTE — Progress Notes (Signed)
Established Patient Office Visit  Subjective:  Patient ID: Natalie Cuevas, female    DOB: [birth year]  Age: 67 y.o. MRN: [id number]  Chief Complaint  Patient presents with   Follow-up    3 month follow up    Patient is here today for her 3 months follow up.  She has been feeling well since last appointment.   She does have additional concerns to discuss today.  She has still been having trouble with her shoulder, did have her MRI done, so we discussed these results in detail today.  Asks if there is anything I can suggest besides heat to help with her pain.   Labs are due today. She needs refills.   I have reviewed her active problem list, medication list, allergies, health maintenance, notes from last encounter, lab results for her appointment today.      No other concerns at this time.   Past Medical History:  Diagnosis Date   Hypertension    Tooth abscess 12/01/2014    Past Surgical History:  Procedure Laterality Date   ABDOMINAL HYSTERECTOMY     APPENDECTOMY     CHOLECYSTECTOMY     TONSILLECTOMY      Social History   Socioeconomic History   Marital status: Single    Spouse name: Not on file   Number of children: Not on file   Years of education: Not on file   Highest education level: Not on file  Occupational History   Not on file  Tobacco Use   Smoking status: Former    Current packs/day: 0.25    Types: Cigarettes   Smokeless tobacco: Never  Vaping Use   Vaping status: Never Used  Substance and Sexual Activity   Alcohol use: No   Drug use: No    Types: "Crack" cocaine   Sexual activity: Not Currently  Other Topics Concern   Not on file  Social History Narrative   Not on file   Social Determinants of Health   Financial Resource Strain: Not on file  Food Insecurity: Not on file  Transportation Needs: Not on file  Physical Activity: Not on file  Stress: Not on file  Social Connections: Not on file  Intimate Partner Violence: Not on  file    Family History  Problem Relation Age of Onset   Hypertension Mother    Huntington's disease Father    Breast cancer Neg Hx     Allergies  Allergen Reactions   Codeine     Review of Systems  Musculoskeletal:  Positive for joint pain.  All other systems reviewed and are negative.      Objective:   BP 124/71   Resp (!) 71   Ht 5' (1.524 m)   Wt 260 lb 12.8 oz (118.3 kg)   SpO2 97%   BMI 50.93 kg/m   Vitals:   02/03/23 1032  BP: 124/71  Resp: (!) 71  Height: 5' (1.524 m)  Weight: 260 lb 12.8 oz (118.3 kg)  SpO2: 97%  BMI (Calculated): 50.93    Physical Exam Vitals and nursing note reviewed.  Constitutional:      Appearance: Normal appearance. She is normal weight.  HENT:     Head: Normocephalic.  Eyes:     Pupils: Pupils are equal, round, and reactive to light.  Cardiovascular:     Rate and Rhythm: Normal rate.  Pulmonary:     Effort: Pulmonary effort is normal.  Musculoskeletal:     Left shoulder: Tenderness  present. Decreased range of motion. Decreased strength.  Neurological:     General: No focal deficit present.     Mental Status: She is alert and oriented to person, place, and time. Mental status is at baseline.  Psychiatric:        Mood and Affect: Mood normal.        Behavior: Behavior normal.        Thought Content: Thought content normal.        Judgment: Judgment normal.      No results found for any visits on 02/03/23.  No results found for this or any previous visit (from the past 2160 hour(s)).     Assessment & Plan:   Problem List Items Addressed This Visit       Active Problems   Hypertension    Blood pressure well controlled with current medications.  Continue current therapy.  Will reassess at follow up.       Prediabetes    A1C Continues to be in prediabetic ranges.  Will reassess at follow up after next lab check.  Patient counseled on dietary choices and verbalized understanding.  Patient educated on  foods that contain carbohydrates and the need to decrease intake.  We discussed prediabetes, and what it means and the need for strict dietary control to prevent progression to type 2 diabetes.  Advised to decrease intake of sugary drinks, including sodas, sweet tea, and some juices, and of starch and sugar heavy foods (ie., potatoes, rice, bread, pasta, desserts). She verbalizes understanding and agreement with the changes discussed today.        Relevant Orders   CMP14+EGFR   Hemoglobin A1c   CBC with Differential/Platelet   Hyperlipidemia, mixed    Checking labs today.  Continue current therapy for lipid control. Will modify as needed based on labwork results.       Relevant Orders   Lipid panel   CMP14+EGFR   CBC with Differential/Platelet   Vitamin D deficiency    Checking labs today.  Will continue supplements as needed.        Relevant Orders   VITAMIN D 25 Hydroxy (Vit-D Deficiency, Fractures)   CMP14+EGFR   CBC with Differential/Platelet   Morbid obesity (HCC) - Primary    Continue current meds.  Will adjust as needed based on results.  The patient is asked to make an attempt to improve diet and exercise patterns to aid in medical management of this problem. Addressed importance of increasing and maintaining water intake.       Relevant Orders   CMP14+EGFR   CBC with Differential/Platelet   B12 deficiency due to diet    Checking labs today.  Will continue supplements as needed.       Relevant Orders   CMP14+EGFR   Vitamin B12   CBC with Differential/Platelet   Tendinosis of left rotator cuff    Sending referral to physical therapy.  Will reassess pt condition at follow up.       Relevant Orders   Ambulatory referral to Physical Therapy   CMP14+EGFR   CBC with Differential/Platelet   Other Visit Diagnoses     Other fatigue       Relevant Orders   CMP14+EGFR   TSH   CBC with Differential/Platelet       Return in about 3 months (around  05/06/2023) for F/U.   Total time spent: 20 minutes  Miki Kins, FNP  02/03/2023   This document may have been prepared  by [employer] and as such may include unintentional dictation errors.

## 2023-02-03 NOTE — Assessment & Plan Note (Signed)
Continue current meds.  Will adjust as needed based on results.  The patient is asked to make an attempt to improve diet and exercise patterns to aid in medical management of this problem. Addressed importance of increasing and maintaining water intake.   

## 2023-02-04 LAB — CMP14+EGFR
ALT: 19 IU/L (ref 0–32)
AST: 24 IU/L (ref 0–40)
Albumin: 4 g/dL (ref 3.9–4.9)
Alkaline Phosphatase: 77 IU/L (ref 44–121)
BUN/Creatinine Ratio: 9 — ABNORMAL LOW (ref 12–28)
BUN: 9 mg/dL (ref 8–27)
Bilirubin Total: 0.8 mg/dL (ref 0.0–1.2)
CO2: 25 mmol/L (ref 20–29)
Calcium: 9.3 mg/dL (ref 8.7–10.3)
Chloride: 102 mmol/L (ref 96–106)
Creatinine, Ser: 0.95 mg/dL (ref 0.57–1.00)
Globulin, Total: 3.1 g/dL (ref 1.5–4.5)
Glucose: 97 mg/dL (ref 70–99)
Potassium: 4.2 mmol/L (ref 3.5–5.2)
Sodium: 138 mmol/L (ref 134–144)
Total Protein: 7.1 g/dL (ref 6.0–8.5)
eGFR: 65 mL/min/{1.73_m2} (ref >59)

## 2023-02-04 LAB — LIPID PANEL
Chol/HDL Ratio: 3.1 ratio (ref 0.0–4.4)
Cholesterol, Total: 120 mg/dL (ref 100–199)
HDL: 39 mg/dL — ABNORMAL LOW (ref >39)
LDL Chol Calc (NIH): 63 mg/dL (ref 0–99)
Triglycerides: 93 mg/dL (ref 0–149)
VLDL Cholesterol Cal: 18 mg/dL (ref 5–40)

## 2023-02-04 LAB — VITAMIN B12: Vitamin B-12: 581 pg/mL (ref 232–1245)

## 2023-02-04 LAB — VITAMIN D 25 HYDROXY (VIT D DEFICIENCY, FRACTURES): Vit D, 25-Hydroxy: 72.6 ng/mL (ref 30.0–100.0)

## 2023-02-04 LAB — HEMOGLOBIN A1C
Est. average glucose Bld gHb Est-mCnc: 140 mg/dL
Hgb A1c MFr Bld: 6.5 % — ABNORMAL HIGH (ref 4.8–5.6)

## 2023-02-04 LAB — TSH: TSH: 3.06 u[IU]/mL (ref 0.450–4.500)

## 2023-02-13 ENCOUNTER — Telehealth: Payer: Self-pay | Admitting: Family

## 2023-02-13 NOTE — Telephone Encounter (Signed)
Patient came in stating that the ozempic is making her nauseous & would like to know what she is suppose to do about this, because she due to have another shot tomorrow. Please adv

## 2023-02-14 ENCOUNTER — Other Ambulatory Visit: Payer: Self-pay

## 2023-02-14 MED ORDER — MOUNJARO 2.5 MG/0.5ML ~~LOC~~ SOAJ: 2.5000 mg | SUBCUTANEOUS | 0 refills | Status: DC

## 2023-02-14 NOTE — Telephone Encounter (Signed)
Patient informed that we would be changing the ozempic to [facility] and sending in a new rx that would likely have a PA and would take a few days for this to get completed

## 2023-02-17 ENCOUNTER — Telehealth: Payer: Self-pay

## 2023-02-17 DIAGNOSIS — M25512 Pain in left shoulder: Secondary | ICD-10-CM | POA: Diagnosis not present

## 2023-02-17 DIAGNOSIS — M6281 Muscle weakness (generalized): Secondary | ICD-10-CM | POA: Diagnosis not present

## 2023-02-17 DIAGNOSIS — M25612 Stiffness of left shoulder, not elsewhere classified: Secondary | ICD-10-CM | POA: Diagnosis not present

## 2023-02-17 DIAGNOSIS — M799 Soft tissue disorder, unspecified: Secondary | ICD-10-CM | POA: Diagnosis not present

## 2023-02-17 NOTE — Telephone Encounter (Signed)
Patient called asking about the other rx that we said we would call in for her,Mounjaro  we have sent it to the pharmacy already, patient neeeds to call them and ask about a PA if they need one since  we have not received one yet

## 2023-02-21 DIAGNOSIS — M25612 Stiffness of left shoulder, not elsewhere classified: Secondary | ICD-10-CM | POA: Diagnosis not present

## 2023-02-21 DIAGNOSIS — M6281 Muscle weakness (generalized): Secondary | ICD-10-CM | POA: Diagnosis not present

## 2023-02-21 DIAGNOSIS — M799 Soft tissue disorder, unspecified: Secondary | ICD-10-CM | POA: Diagnosis not present

## 2023-02-21 DIAGNOSIS — M25512 Pain in left shoulder: Secondary | ICD-10-CM | POA: Diagnosis not present

## 2023-02-22 ENCOUNTER — Other Ambulatory Visit: Payer: Self-pay | Admitting: Family

## 2023-04-11 ENCOUNTER — Telehealth: Payer: Self-pay

## 2023-04-11 NOTE — Patient Outreach (Signed)
Successful call to patient on today regarding preventative mammogram screening. Patient declined at this time and will follow up with PCP at later date.  Baruch Gouty [facility] Assistant [facility] [phone]

## 2023-05-02 ENCOUNTER — Encounter: Payer: Self-pay | Admitting: Family

## 2023-05-02 ENCOUNTER — Ambulatory Visit (INDEPENDENT_AMBULATORY_CARE_PROVIDER_SITE_OTHER): Payer: Medicare Other | Admitting: Family

## 2023-05-02 DIAGNOSIS — Z1211 Encounter for screening for malignant neoplasm of colon: Secondary | ICD-10-CM | POA: Diagnosis not present

## 2023-05-02 DIAGNOSIS — I10 Essential (primary) hypertension: Secondary | ICD-10-CM | POA: Diagnosis not present

## 2023-05-02 DIAGNOSIS — Z1231 Encounter for screening mammogram for malignant neoplasm of breast: Secondary | ICD-10-CM | POA: Diagnosis not present

## 2023-05-02 DIAGNOSIS — E1165 Type 2 diabetes mellitus with hyperglycemia: Secondary | ICD-10-CM | POA: Insufficient documentation

## 2023-05-02 DIAGNOSIS — E538 Deficiency of other specified B group vitamins: Secondary | ICD-10-CM | POA: Diagnosis not present

## 2023-05-02 DIAGNOSIS — R5383 Other fatigue: Secondary | ICD-10-CM | POA: Diagnosis not present

## 2023-05-02 DIAGNOSIS — Z23 Encounter for immunization: Secondary | ICD-10-CM | POA: Diagnosis not present

## 2023-05-02 DIAGNOSIS — E559 Vitamin D deficiency, unspecified: Secondary | ICD-10-CM

## 2023-05-02 DIAGNOSIS — R7303 Prediabetes: Secondary | ICD-10-CM

## 2023-05-02 DIAGNOSIS — E782 Mixed hyperlipidemia: Secondary | ICD-10-CM

## 2023-05-02 LAB — POC CREATINE & ALBUMIN,URINE
Creatinine, POC: 300 mg/dL
Microalbumin Ur, POC: 150 mg/L

## 2023-05-02 NOTE — Progress Notes (Unsigned)
Established Patient Office Visit  Subjective:  Patient ID: Natalie Cuevas, female    DOB: 1955-07-02  Age: 67 y.o. MRN: [id number]  Chief Complaint  Patient presents with  . Follow-up    3 mo    HPI  No other concerns at this time.   Past Medical History:  Diagnosis Date  . Hypertension   . Tooth abscess 12/01/2014    Past Surgical History:  Procedure Laterality Date  . ABDOMINAL HYSTERECTOMY    . APPENDECTOMY    . CHOLECYSTECTOMY    . TONSILLECTOMY      Social History   Socioeconomic History  . Marital status: Single    Spouse name: Not on file  . Number of children: Not on file  . Years of education: Not on file  . Highest education level: Not on file  Occupational History  . Not on file  Tobacco Use  . Smoking status: Former    Current packs/day: 0.25    Types: Cigarettes  . Smokeless tobacco: Never  Vaping Use  . Vaping status: Never Used  Substance and Sexual Activity  . Alcohol use: No  . Drug use: No    Types: "Crack" cocaine  . Sexual activity: Not Currently  Other Topics Concern  . Not on file  Social History Narrative  . Not on file   Social Determinants of Health   Financial Resource Strain: Not on file  Food Insecurity: Not on file  Transportation Needs: Not on file  Physical Activity: Not on file  Stress: Not on file  Social Connections: Not on file  Intimate Partner Violence: Not on file    Family History  Problem Relation Age of Onset  . Hypertension Mother   . Huntington's disease Father   . Breast cancer Neg Hx     Allergies  Allergen Reactions  . Codeine     Review of Systems  All other systems reviewed and are negative.      Objective:   BP 130/80   Pulse 62   Ht 5\' 5"  (1.651 m)   Wt 255 lb 6.4 oz (115.8 kg)   SpO2 97%   BMI 42.50 kg/m   Vitals:   05/02/23 0940  BP: 130/80  Pulse: 62  Height: 5\' 5"  (1.651 m)  Weight: 255 lb 6.4 oz (115.8 kg)  SpO2: 97%  BMI (Calculated): 42.5    Physical  Exam Vitals and nursing note reviewed.  Constitutional:      Appearance: Normal appearance. She is normal weight.  HENT:     Head: Normocephalic.  Eyes:     Extraocular Movements: Extraocular movements intact.     Conjunctiva/sclera: Conjunctivae normal.     Pupils: Pupils are equal, round, and reactive to light.  Cardiovascular:     Rate and Rhythm: Normal rate.  Pulmonary:     Effort: Pulmonary effort is normal.  Neurological:     General: No focal deficit present.     Mental Status: She is alert and oriented to person, place, and time. Mental status is at baseline.  Psychiatric:        Mood and Affect: Mood normal.        Behavior: Behavior normal.        Thought Content: Thought content normal.     No results found for any visits on 05/02/23.  Recent Results (from the past 2160 hour(s))  Lipid panel     Status: Abnormal   Collection Time: 02/03/23 10:57 AM  Result  Value Ref Range   Cholesterol, Total 120 100 - 199 mg/dL   Triglycerides 93 0 - 149 mg/dL   HDL 39 (L) >02 mg/dL   VLDL Cholesterol Cal 18 5 - 40 mg/dL   LDL Chol Calc (NIH) 63 0 - 99 mg/dL   Chol/HDL Ratio 3.1 0.0 - 4.4 ratio    Comment:                                   T. Chol/HDL Ratio                                             Men  Women                               1/2 Avg.Risk  3.4    3.3                                   Avg.Risk  5.0    4.4                                2X Avg.Risk  9.6    7.1                                3X Avg.Risk 23.4   11.0   VITAMIN D 25 Hydroxy (Vit-D Deficiency, Fractures)     Status: None   Collection Time: 02/03/23 10:57 AM  Result Value Ref Range   Vit D, 25-Hydroxy 72.6 30.0 - 100.0 ng/mL    Comment: Vitamin D deficiency has been defined by the Institute of Medicine and an Endocrine Society practice guideline as a level of serum 25-OH vitamin D less than 20 ng/mL (1,2). The Endocrine Society went on to further define vitamin D insufficiency as a level between  21 and 29 ng/mL (2). 1. IOM (Institute of Medicine). 2010. Dietary reference    intakes for calcium and D. [place]: The    [employer]. 2. Holick MF, Binkley Goodrich, Bischoff-Ferrari HA, et al.    Evaluation, treatment, and prevention of vitamin D    deficiency: an Endocrine Society clinical practice    guideline. JCEM. 2011 Jul; 96(7):1911-30.   CMP14+EGFR     Status: Abnormal   Collection Time: 02/03/23 10:57 AM  Result Value Ref Range   Glucose 97 70 - 99 mg/dL   BUN 9 8 - 27 mg/dL   Creatinine, Ser 5.85 0.57 - 1.00 mg/dL   eGFR 65 >27 PO/EUM/3.53   BUN/Creatinine Ratio 9 (L) 12 - 28   Sodium 138 134 - 144 mmol/L   Potassium 4.2 3.5 - 5.2 mmol/L   Chloride 102 96 - 106 mmol/L   CO2 25 20 - 29 mmol/L   Calcium 9.3 8.7 - 10.3 mg/dL   Total Protein 7.1 6.0 - 8.5 g/dL   Albumin 4.0 3.9 - 4.9 g/dL   Globulin, Total 3.1 1.5 - 4.5 g/dL   Bilirubin Total 0.8 0.0 - 1.2 mg/dL   Alkaline Phosphatase 77 44 - 121 IU/L  AST 24 0 - 40 IU/L   ALT 19 0 - 32 IU/L  TSH     Status: None   Collection Time: 02/03/23 10:57 AM  Result Value Ref Range   TSH 3.060 0.450 - 4.500 uIU/mL  Hemoglobin A1c     Status: Abnormal   Collection Time: 02/03/23 10:57 AM  Result Value Ref Range   Hgb A1c MFr Bld 6.5 (H) 4.8 - 5.6 %    Comment:          Prediabetes: 5.7 - 6.4          Diabetes: >6.4          Glycemic control for adults with diabetes: <7.0    Est. average glucose Bld gHb Est-mCnc 140 mg/dL  Vitamin W09     Status: None   Collection Time: 02/03/23 10:57 AM  Result Value Ref Range   Vitamin B-12 581 232 - 1,245 pg/mL       Assessment & Plan:   Problem List Items Addressed This Visit       Active Problems   Hypertension   Relevant Orders   CMP14+EGFR   CBC with Diff   Hyperlipidemia, mixed   Relevant Orders   Lipid panel   CMP14+EGFR   CBC with Diff   Vitamin D deficiency   Relevant Orders   VITAMIN D 25 Hydroxy (Vit-D Deficiency, Fractures)   CMP14+EGFR    CBC with Diff   Morbid obesity (HCC) - Primary   Relevant Orders   CMP14+EGFR   CBC with Diff   B12 deficiency due to diet   Relevant Orders   CMP14+EGFR   Vitamin B12   CBC with Diff   Type 2 diabetes mellitus with hyperglycemia, without long-term current use of insulin (HCC)   Relevant Orders   CMP14+EGFR   Hemoglobin A1c   CBC with Diff   Microalbumin / Creatinine Urine Ratio   Other Visit Diagnoses     Other fatigue       Relevant Orders   TSH   Flu vaccine need       Relevant Orders   Flu Vaccine Trivalent High Dose (Fluad)   Screening for colon cancer       Relevant Orders   Cologuard   Screening mammogram for breast cancer       Relevant Orders   MM 3D SCREENING MAMMOGRAM BILATERAL BREAST       Return in about 1 month (around 06/01/2023) for AWV.   Total time spent: {AMA time spent:29001} minutes  Miki Kins, FNP  05/02/2023   This document may have been prepared by [facility] Voice Recognition software and as such may include unintentional dictation errors.

## 2023-05-03 LAB — LIPID PANEL
Chol/HDL Ratio: 3.3 ratio (ref 0.0–4.4)
Cholesterol, Total: 144 mg/dL (ref 100–199)
HDL: 43 mg/dL (ref >39)
LDL Chol Calc (NIH): 78 mg/dL (ref 0–99)
Triglycerides: 130 mg/dL (ref 0–149)
VLDL Cholesterol Cal: 23 mg/dL (ref 5–40)

## 2023-05-03 LAB — CMP14+EGFR
ALT: 14 [IU]/L (ref 0–32)
AST: 19 [IU]/L (ref 0–40)
Albumin: 3.9 g/dL (ref 3.9–4.9)
Alkaline Phosphatase: 82 [IU]/L (ref 44–121)
BUN/Creatinine Ratio: 9 — ABNORMAL LOW (ref 12–28)
BUN: 8 mg/dL (ref 8–27)
Bilirubin Total: 0.7 mg/dL (ref 0.0–1.2)
CO2: 27 mmol/L (ref 20–29)
Calcium: 9.6 mg/dL (ref 8.7–10.3)
Chloride: 101 mmol/L (ref 96–106)
Creatinine, Ser: 0.89 mg/dL (ref 0.57–1.00)
Globulin, Total: 3.1 g/dL (ref 1.5–4.5)
Glucose: 77 mg/dL (ref 70–99)
Potassium: 4.2 mmol/L (ref 3.5–5.2)
Sodium: 141 mmol/L (ref 134–144)
Total Protein: 7 g/dL (ref 6.0–8.5)
eGFR: 71 mL/min/{1.73_m2} (ref >59)

## 2023-05-03 LAB — CBC WITH DIFFERENTIAL/PLATELET
Basophils Absolute: 0 10*3/uL (ref 0.0–0.2)
Basos: 0 %
EOS (ABSOLUTE): 0.3 10*3/uL (ref 0.0–0.4)
Eos: 4 %
Hematocrit: 40 % (ref 34.0–46.6)
Hemoglobin: 12.8 g/dL (ref 11.1–15.9)
Immature Grans (Abs): 0 10*3/uL (ref 0.0–0.1)
Immature Granulocytes: 0 %
Lymphocytes Absolute: 1.7 10*3/uL (ref 0.7–3.1)
Lymphs: 25 %
MCH: 28 pg (ref 26.6–33.0)
MCHC: 32 g/dL (ref 31.5–35.7)
MCV: 88 fL (ref 79–97)
Monocytes Absolute: 0.5 10*3/uL (ref 0.1–0.9)
Monocytes: 7 %
Neutrophils Absolute: 4.3 10*3/uL (ref 1.4–7.0)
Neutrophils: 64 %
Platelets: 222 10*3/uL (ref 150–450)
RBC: 4.57 x10E6/uL (ref 3.77–5.28)
RDW: 13.4 % (ref 11.7–15.4)
WBC: 6.7 10*3/uL (ref 3.4–10.8)

## 2023-05-03 LAB — HEMOGLOBIN A1C
Est. average glucose Bld gHb Est-mCnc: 120 mg/dL
Hgb A1c MFr Bld: 5.8 % — ABNORMAL HIGH (ref 4.8–5.6)

## 2023-05-03 LAB — VITAMIN B12: Vitamin B-12: 479 pg/mL (ref 232–1245)

## 2023-05-03 LAB — VITAMIN D 25 HYDROXY (VIT D DEFICIENCY, FRACTURES): Vit D, 25-Hydroxy: 56.6 ng/mL (ref 30.0–100.0)

## 2023-05-03 LAB — TSH: TSH: 4.63 u[IU]/mL — ABNORMAL HIGH (ref 0.450–4.500)

## 2023-05-03 NOTE — Assessment & Plan Note (Signed)
Blood pressure well controlled with current medications.  Continue current therapy.  Will reassess at follow up.  

## 2023-05-03 NOTE — Assessment & Plan Note (Signed)
Continue current meds.  Will adjust as needed based on results.  The patient is asked to make an attempt to improve diet and exercise patterns to aid in medical management of this problem. Addressed importance of increasing and maintaining water intake.   

## 2023-05-03 NOTE — Assessment & Plan Note (Signed)
Checking labs today.  Will continue supplements as needed.  

## 2023-05-03 NOTE — Assessment & Plan Note (Signed)

## 2023-05-03 NOTE — Assessment & Plan Note (Signed)
Checking labs today.  Continue current therapy for lipid control. Will modify as needed based on labwork results.  

## 2023-05-03 NOTE — Assessment & Plan Note (Addendum)
>>  ASSESSMENT AND PLAN FOR TYPE 2 DIABETES MELLITUS WITH HYPERGLYCEMIA, WITHOUT LONG-TERM CURRENT USE OF INSULIN (HCC) WRITTEN ON 05/03/2023 11:56 AM BY Miki Kins, FNP  Checking labs today. Will call pt. With results  Continue current diabetes POC, as patient has been well controlled on current regimen.  Will adjust meds if needed based on labs.     >>ASSESSMENT AND PLAN FOR PREDIABETES WRITTEN ON 05/03/2023 11:56 AM BY Latacha Texeira M, FNP  A1C Continues to be in prediabetic ranges.  Will reassess at follow up after next lab check.  Patient counseled on dietary choices and verbalized understanding.  Patient educated on foods that contain carbohydrates and the need to decrease intake.  We discussed prediabetes, and what it means and the need for strict dietary control to prevent progression to type 2 diabetes.  Advised to decrease intake of sugary drinks, including sodas, sweet tea, and some juices, and of starch and sugar heavy foods (ie., potatoes, rice, bread, pasta, desserts). She verbalizes understanding and agreement with the changes discussed today.

## 2023-05-05 ENCOUNTER — Ambulatory Visit: Payer: Medicare Other | Admitting: Family

## 2023-05-05 LAB — SPECIMEN STATUS REPORT

## 2023-05-05 LAB — T3: T3, Total: 120 ng/dL (ref 71–180)

## 2023-05-05 LAB — T4: T4, Total: 5.7 ug/dL (ref 4.5–12.0)

## 2023-05-06 ENCOUNTER — Other Ambulatory Visit: Payer: Self-pay | Admitting: Family

## 2023-06-02 ENCOUNTER — Ambulatory Visit: Payer: Medicare Other | Admitting: Family

## 2023-06-07 ENCOUNTER — Ambulatory Visit: Payer: Medicare Other | Admitting: Family

## 2023-06-07 ENCOUNTER — Encounter: Payer: Self-pay | Admitting: Family

## 2023-06-07 VITALS — BP 116/86 | HR 59 | Ht 65.0 in | Wt 257.0 lb

## 2023-06-07 DIAGNOSIS — Z0130 Encounter for examination of blood pressure without abnormal findings: Secondary | ICD-10-CM

## 2023-06-07 DIAGNOSIS — Z1211 Encounter for screening for malignant neoplasm of colon: Secondary | ICD-10-CM | POA: Diagnosis not present

## 2023-06-07 DIAGNOSIS — E1165 Type 2 diabetes mellitus with hyperglycemia: Secondary | ICD-10-CM

## 2023-06-07 DIAGNOSIS — Z0000 Encounter for general adult medical examination without abnormal findings: Secondary | ICD-10-CM | POA: Diagnosis not present

## 2023-06-07 NOTE — Patient Instructions (Addendum)
Look into getting the Prevnar 20 vaccine from [facility].    Health Maintenance, Female Adopting a healthy lifestyle and getting preventive care are important in promoting health and wellness. Ask your health care provider about: The right schedule for you to have regular tests and exams. Things you can do on your own to prevent diseases and keep yourself healthy. What should I know about diet, weight, and exercise? Eat a healthy diet  Eat a diet that includes plenty of vegetables, fruits, low-fat dairy products, and lean protein. Do not eat a lot of foods that are high in solid fats, added sugars, or sodium. Maintain a healthy weight Body mass index (BMI) is used to identify weight problems. It estimates body fat based on height and weight. Your health care provider can help determine your BMI and help you achieve or maintain a healthy weight. Get regular exercise Get regular exercise. This is one of the most important things you can do for your health. Most adults should: Exercise for at least 150 minutes each week. The exercise should increase your heart rate and make you sweat (moderate-intensity exercise). Do strengthening exercises at least twice a week. This is in addition to the moderate-intensity exercise. Spend less time sitting. Even light physical activity can be beneficial. Watch cholesterol and blood lipids Have your blood tested for lipids and cholesterol at 69 years of age, then have this test every 5 years. Have your cholesterol levels checked more often if: Your lipid or cholesterol levels are high. You are older than 69 years of age. You are at high risk for heart disease. What should I know about cancer screening? Depending on your health history and family history, you may need to have cancer screening at various ages. This may include screening for: Breast cancer. Cervical cancer. Colorectal cancer. Skin cancer. Lung cancer. What should I know about heart disease,  diabetes, and high blood pressure? Blood pressure and heart disease High blood pressure causes heart disease and increases the risk of stroke. This is more likely to develop in people who have high blood pressure readings or are overweight. Have your blood pressure checked: Every 3-5 years if you are 77-62 years of age. Every year if you are 83 years old or older. Diabetes Have regular diabetes screenings. This checks your fasting blood sugar level. Have the screening done: Once every three years after age 25 if you are at a normal weight and have a low risk for diabetes. More often and at a younger age if you are overweight or have a high risk for diabetes. What should I know about preventing infection? Hepatitis B If you have a higher risk for hepatitis B, you should be screened for this virus. Talk with your health care provider to find out if you are at risk for hepatitis B infection. Hepatitis C Testing is recommended for: Everyone born from [birth year] through 1967. Anyone with known risk factors for hepatitis C. Sexually transmitted infections (STIs) Get screened for STIs, including gonorrhea and chlamydia, if: You are sexually active and are younger than 69 years of age. You are older than 69 years of age and your health care provider tells you that you are at risk for this type of infection. Your sexual activity has changed since you were last screened, and you are at increased risk for chlamydia or gonorrhea. Ask your health care provider if you are at risk. Ask your health care provider about whether you are at high risk for HIV. Your  health care provider may recommend a prescription medicine to help prevent HIV infection. If you choose to take medicine to prevent HIV, you should first get tested for HIV. You should then be tested every 3 months for as long as you are taking the medicine. Pregnancy If you are about to stop having your period (premenopausal) and you may become pregnant,  seek counseling before you get pregnant. Take 400 to 800 micrograms (mcg) of folic acid every day if you become pregnant. Ask for birth control (contraception) if you want to prevent pregnancy. Osteoporosis and menopause Osteoporosis is a disease in which the bones lose minerals and strength with aging. This can result in bone fractures. If you are 57 years old or older, or if you are at risk for osteoporosis and fractures, ask your health care provider if you should: Be screened for bone loss. Take a calcium or vitamin D supplement to lower your risk of fractures. Be given hormone replacement therapy (HRT) to treat symptoms of menopause. Follow these instructions at home: Alcohol use Do not drink alcohol if: Your health care provider tells you not to drink. You are pregnant, may be pregnant, or are planning to become pregnant. If you drink alcohol: Limit how much you have to: 0-1 drink a day. Know how much alcohol is in your drink. In the [place] one drink equals one 12 oz bottle of beer (355 mL), one 5 oz glass of wine (148 mL), or one 1 oz glass of hard liquor (44 mL). Lifestyle Do not use any products that contain nicotine or tobacco. These products include cigarettes, chewing tobacco, and vaping devices, such as e-cigarettes. If you need help quitting, ask your health care provider. Do not use street drugs. Do not share needles. Ask your health care provider for help if you need support or information about quitting drugs. General instructions Schedule regular health, dental, and eye exams. Stay current with your vaccines. Tell your health care provider if: You often feel depressed. You have ever been abused or do not feel safe at home. Summary Adopting a healthy lifestyle and getting preventive care are important in promoting health and wellness. Follow your health care provider's instructions about healthy diet, exercising, and getting tested or screened for diseases. Follow your  health care provider's instructions on monitoring your cholesterol and blood pressure. This information is not intended to replace advice given to you by your health care provider. Make sure you discuss any questions you have with your health care provider. Document Revised: 10/26/2020 Document Reviewed: 10/26/2020 Elsevier Patient Education  2024 [employer].

## 2023-06-07 NOTE — Progress Notes (Signed)
Annual Wellness Visit  Patient: Natalie Cuevas, Female    DOB: [birth year], 67 y.o.   MRN: [id number] Visit Date: 06/17/2023  Today's Provider: Miki Kins, FNP  Subjective:    Chief Complaint  Patient presents with   Annual Exam    AWV   Natalie Cuevas is a 67 y.o. female who presents today for her Annual Wellness Visit.    Past Medical History:  Diagnosis Date   Hypertension    Tooth abscess 12/01/2014   Past Surgical History:  Procedure Laterality Date   ABDOMINAL HYSTERECTOMY     APPENDECTOMY     CHOLECYSTECTOMY     TONSILLECTOMY     Family History  Problem Relation Age of Onset   Hypertension Mother    Huntington's disease Father    Breast cancer Neg Hx    Social History   Socioeconomic History   Marital status: Single    Spouse name: Not on file   Number of children: Not on file   Years of education: Not on file   Highest education level: Not on file  Occupational History   Not on file  Tobacco Use   Smoking status: Former    Current packs/day: 0.25    Types: Cigarettes   Smokeless tobacco: Never  Vaping Use   Vaping status: Never Used  Substance and Sexual Activity   Alcohol use: No   Drug use: No    Types: "Crack" cocaine   Sexual activity: Not Currently  Other Topics Concern   Not on file  Social History Narrative   Not on file   Social Drivers of Health   Financial Resource Strain: Low Risk  (06/17/2023)   Overall Financial Resource Strain (CARDIA)    Difficulty of Paying Living Expenses: Not hard at all  Food Insecurity: No Food Insecurity (06/07/2023)   Hunger Vital Sign    Worried About Running Out of Food in the Last Year: Never true    Ran Out of Food in the Last Year: Never true  Transportation Needs: No Transportation Needs (06/07/2023)   PRAPARE - Administrator, Civil Service (Medical): No    Lack of Transportation (Non-Medical): No  Physical Activity: Insufficiently Active (06/07/2023)   Exercise  Vital Sign    Days of Exercise per Week: 4 days    Minutes of Exercise per Session: 30 min  Stress: No Stress Concern Present (06/07/2023)   [employer] of Occupational Health - Occupational Stress Questionnaire    Feeling of Stress : Not at all  Social Connections: Not on file  Intimate Partner Violence: Not At Risk (06/07/2023)   Humiliation, Afraid, Rape, and Kick questionnaire    Fear of Current or Ex-Partner: No    Emotionally Abused: No    Physically Abused: No    Sexually Abused: No    Medications: Outpatient Medications Prior to Visit  Medication Sig   aspirin 81 MG chewable tablet Chew by mouth daily.   lisinopril (ZESTRIL) 20 MG tablet TAKE 1 TABLET BY MOUTH ONCE  DAILY   rosuvastatin (CRESTOR) 10 MG tablet TAKE 1 TABLET BY MOUTH ONCE  DAILY FOR CHOLESTEROL   Vitamin D, Ergocalciferol, (DRISDOL) 1.25 MG (50000 UNIT) CAPS capsule TAKE 1 CAPSULE BY MOUTH ONCE  WEEKLY   [DISCONTINUED] tirzepatide (MOUNJARO) 2.5 MG/0.5ML Pen Inject 2.5 mg into the skin once a week.   No facility-administered medications prior to visit.    Allergies  Allergen Reactions   Codeine  Patient Care Team: Miki Kins, FNP as PCP - General (Family Medicine)  Review of Systems  All other systems reviewed and are negative.     Objective:    Vitals: BP 116/86   Pulse (!) 59   Ht 5\' 5"  (1.651 m)   Wt 257 lb (116.6 kg)   SpO2 97%   BMI 42.77 kg/m    Physical Exam Vitals and nursing note reviewed.  Constitutional:      General: She is awake.     Appearance: Normal appearance. She is well-developed. She is morbidly obese.  HENT:     Head: Normocephalic.  Eyes:     Extraocular Movements: Extraocular movements intact.     Conjunctiva/sclera: Conjunctivae normal.     Pupils: Pupils are equal, round, and reactive to light.  Cardiovascular:     Rate and Rhythm: Normal rate.  Pulmonary:     Effort: Pulmonary effort is normal.  Musculoskeletal:        General: Normal  range of motion.     Cervical back: Normal range of motion.  Neurological:     General: No focal deficit present.     Mental Status: She is alert and oriented to person, place, and time. Mental status is at baseline.  Psychiatric:        Mood and Affect: Mood normal.        Behavior: Behavior normal. Behavior is cooperative.        Thought Content: Thought content normal.        Judgment: Judgment normal.      Most recent functional status assessment:    06/17/2023    7:00 PM  In your present state of health, do you have any difficulty performing the following activities:  Hearing? 0  Vision? 0  Difficulty concentrating or making decisions? 0  Walking or climbing stairs? 0  Dressing or bathing? 0  Doing errands, shopping? 0  Preparing Food and eating ? N  Using the Toilet? N  In the past six months, have you accidently leaked urine? N  Do you have problems with loss of bowel control? N  Managing your Medications? N  Managing your Finances? N  Housekeeping or managing your Housekeeping? N    Most recent fall risk assessment:    06/17/2023    6:39 PM  Fall Risk   Falls in the past year? 0  Number falls in past yr: 0  Injury with Fall? 0  Risk for fall due to : No Fall Risks     Most recent depression screenings:    06/17/2023    6:51 PM 06/17/2023    6:00 PM  PHQ 2/9 Scores  PHQ - 2 Score 0 0  PHQ- 9 Score 0     Most recent cognitive screening:    06/17/2023    6:38 PM  6CIT Screen  What Year? 0 points  What month? 0 points  What time? 0 points  Count back from 20 0 points  Months in reverse 0 points  Repeat phrase 0 points  Total Score 0 points    No results found for any visits on 06/07/23.     Assessment & Plan:      Annual wellness visit done today including the all of the following: Reviewed patient's Family Medical History Reviewed and updated list of patient's medical providers Assessment of cognitive impairment was done Assessed  patient's functional ability Established a written schedule for health screening services Health Risk Assessent Completed and  Reviewed  Exercise Activities and Dietary recommendations  Goals      Absence of Fall and Fall-Related Injury     Evidence-based guidance:  Assess fall risk using a validated tool when available. Consider balance and gait impairment, muscle weakness, diminished vision or hearing, environmental hazards, presence of urinary or bowel urgency and/or incontinence.  Communicate fall injury risk to interprofessional healthcare team.  Develop a fall prevention plan with the patient and family.  Promote use of personal vision and auditory aids.  Promote reorientation, appropriate sensory stimulation, and routines to decrease risk of fall when changes in mental status are present.  Assess assistance level required for safe and effective self-care; consider referral for home care.  Encourage physical activity, such as performance of self-care at highest level of ability, strength and balance exercise program, and provision of appropriate assistive devices; refer to rehabilitation therapy.  Refer to community-based fall prevention program where available.  If fall occurs, determine the cause and revise fall injury prevention plan.  Regularly review medication contribution to fall risk; consider risk related to polypharmacy and age.  Refer to pharmacist for consultation when concerns about medications are revealed.  Balance adequate pain management with potential for oversedation.  Provide guidance related to environmental modifications.  Consider supplementation with Vitamin D.   Notes:      Weight (lb) < 200 lb (90.7 kg)        Immunization History  Administered Date(s) Administered   Fluad Trivalent(High Dose 65+) 05/02/2023   Influenza,inj,Quad PF,6+ Mos 04/23/2019   Influenza-Unspecified 03/25/2020, 04/05/2021, 04/05/2022   Moderna Sars-Covid-2 Vaccination  11/13/2019, 11/21/2019, 06/19/2020   Pneumococcal Polysaccharide-23 07/09/2019   Tdap 06/27/2019   Zoster Recombinant(Shingrix) 05/25/2023    Health Maintenance  Topic Date Due   OPHTHALMOLOGY EXAM  Never done   Pneumonia Vaccine 66+ Years old (2 of 2 - PCV) 07/08/2020   Fecal DNA (Cologuard)  10/14/2022   MAMMOGRAM  12/11/2022   COVID-19 Vaccine (4 - 2024-25 season) 02/19/2023   Zoster Vaccines- Shingrix (2 of 2) 07/20/2023   HEMOGLOBIN A1C  10/30/2023   Diabetic kidney evaluation - eGFR measurement  05/01/2024   Diabetic kidney evaluation - Urine ACR  05/01/2024   FOOT EXAM  06/06/2024   Medicare Annual Wellness (AWV)  06/06/2024   DTaP/Tdap/Td (2 - Td or Tdap) 06/26/2029   INFLUENZA VACCINE  Completed   DEXA SCAN  Completed   Hepatitis C Screening  Completed   HPV VACCINES  Aged Out     Discussed health benefits of physical activity, and encouraged her to engage in regular exercise appropriate for her age and condition.        Miki Kins, FNP   06/07/2023  This document may have been prepared by Dragon Voice Recognition software and as such may include unintentional dictation errors.

## 2023-06-17 ENCOUNTER — Encounter: Payer: Self-pay | Admitting: Family

## 2023-06-23 ENCOUNTER — Ambulatory Visit (INDEPENDENT_AMBULATORY_CARE_PROVIDER_SITE_OTHER): Payer: Medicare Other | Admitting: Cardiology

## 2023-06-23 ENCOUNTER — Encounter: Payer: Self-pay | Admitting: Cardiology

## 2023-06-23 VITALS — BP 150/100 | HR 83 | Ht 65.0 in | Wt 256.2 lb

## 2023-06-23 DIAGNOSIS — J069 Acute upper respiratory infection, unspecified: Secondary | ICD-10-CM | POA: Insufficient documentation

## 2023-06-23 DIAGNOSIS — Z0130 Encounter for examination of blood pressure without abnormal findings: Secondary | ICD-10-CM

## 2023-06-23 MED ORDER — AMOXICILLIN-POT CLAVULANATE 875-125 MG PO TABS: 1.0000 | ORAL_TABLET | Freq: Two times a day (BID) | ORAL | 0 refills | Status: AC

## 2023-06-23 MED ORDER — FLUTICASONE PROPIONATE 50 MCG/ACT NA SUSP: 1.0000 | Freq: Every day | NASAL | 2 refills | Status: AC

## 2023-06-23 MED ORDER — BENZONATATE 100 MG PO CAPS
100.0000 mg | ORAL_CAPSULE | Freq: Three times a day (TID) | ORAL | 1 refills | Status: DC | PRN
Start: 2023-06-23 — End: 2023-08-21

## 2023-06-23 NOTE — Patient Instructions (Addendum)
 Augmentin Flonase nasal spray Mucinex Tessalon pearls for cough Drink  plenty of water

## 2023-06-23 NOTE — Progress Notes (Signed)
 Established Patient Office Visit  Subjective:  Patient ID: Natalie Cuevas, female    DOB: 02/14/1956  Age: 68 y.o. MRN: [id number]  Chief Complaint  Patient presents with   Nasal Congestion    Patient in office for an acute visit. Patient complaining of nasal congestion, ear pain, PND, runny nose, and sore throat for the past 4 days. Will send in Augmentin , Tessalon  pearls, and Flonase . Drink plenty of water.   Cough This is a new problem. The current episode started in the past 7 days. The problem has been gradually worsening. The cough is Productive of brown sputum. Associated symptoms include ear congestion, ear pain, nasal congestion, postnasal drip, rhinorrhea and a sore throat. Pertinent negatives include no chest pain, fever, headaches, myalgias or shortness of breath. The symptoms are aggravated by lying down. She has tried OTC cough suppressant for the symptoms. The treatment provided no relief.    No other concerns at this time.   Past Medical History:  Diagnosis Date   Hypertension    Tooth abscess 12/01/2014    Past Surgical History:  Procedure Laterality Date   ABDOMINAL HYSTERECTOMY     APPENDECTOMY     CHOLECYSTECTOMY     TONSILLECTOMY      Social History   Socioeconomic History   Marital status: Single    Spouse name: Not on file   Number of children: Not on file   Years of education: Not on file   Highest education level: Not on file  Occupational History   Not on file  Tobacco Use   Smoking status: Former    Current packs/day: 0.25    Types: Cigarettes   Smokeless tobacco: Never  Vaping Use   Vaping status: Never Used  Substance and Sexual Activity   Alcohol use: No   Drug use: No    Types: Crack cocaine   Sexual activity: Not Currently  Other Topics Concern   Not on file  Social History Narrative   Not on file   Social Drivers of Health   Financial Resource Strain: Low Risk  (06/17/2023)   Overall Financial Resource Strain  (CARDIA)    Difficulty of Paying Living Expenses: Not hard at all  Food Insecurity: No Food Insecurity (06/07/2023)   Hunger Vital Sign    Worried About Running Out of Food in the Last Year: Never true    Ran Out of Food in the Last Year: Never true  Transportation Needs: No Transportation Needs (06/07/2023)   PRAPARE - Administrator, Civil Service (Medical): No    Lack of Transportation (Non-Medical): No  Physical Activity: Insufficiently Active (06/07/2023)   Exercise Vital Sign    Days of Exercise per Week: 4 days    Minutes of Exercise per Session: 30 min  Stress: No Stress Concern Present (06/07/2023)   [employer] of Occupational Health - Occupational Stress Questionnaire    Feeling of Stress : Not at all  Social Connections: Not on file  Intimate Partner Violence: Not At Risk (06/07/2023)   Humiliation, Afraid, Rape, and Kick questionnaire    Fear of Current or Ex-Partner: No    Emotionally Abused: No    Physically Abused: No    Sexually Abused: No    Family History  Problem Relation Age of Onset   Hypertension Mother    Huntington's disease Father    Breast cancer Neg Hx     Allergies  Allergen Reactions   Codeine     Outpatient  Medications Prior to Visit  Medication Sig   aspirin 81 MG chewable tablet Chew by mouth daily.   lisinopril  (ZESTRIL ) 20 MG tablet TAKE 1 TABLET BY MOUTH ONCE  DAILY   rosuvastatin (CRESTOR) 10 MG tablet TAKE 1 TABLET BY MOUTH ONCE  DAILY FOR CHOLESTEROL   Vitamin D , Ergocalciferol , (DRISDOL) 1.25 MG (50000 UNIT) CAPS capsule TAKE 1 CAPSULE BY MOUTH ONCE  WEEKLY   No facility-administered medications prior to visit.    Review of Systems  Constitutional: Negative.  Negative for fever.  HENT:  Positive for ear pain, postnasal drip, rhinorrhea and sore throat. Negative for sinus pain.   Eyes: Negative.   Respiratory:  Positive for cough. Negative for shortness of breath.   Cardiovascular: Negative.  Negative for  chest pain.  Gastrointestinal: Negative.  Negative for abdominal pain, constipation and diarrhea.  Genitourinary: Negative.   Musculoskeletal:  Negative for joint pain and myalgias.  Skin: Negative.   Neurological: Negative.  Negative for dizziness and headaches.  Endo/Heme/Allergies: Negative.   All other systems reviewed and are negative.      Objective:   BP (!) 150/100   Pulse 83   Ht 5' 5 (1.651 m)   Wt 256 lb 3.2 oz (116.2 kg)   SpO2 98%   BMI 42.63 kg/m   Vitals:   06/23/23 1132  BP: (!) 150/100  Pulse: 83  Height: 5' 5 (1.651 m)  Weight: 256 lb 3.2 oz (116.2 kg)  SpO2: 98%  BMI (Calculated): 42.63    Physical Exam Vitals and nursing note reviewed.  Constitutional:      Appearance: Normal appearance. She is normal weight.  HENT:     Head: Normocephalic and atraumatic.     Nose: Nose normal.     Mouth/Throat:     Mouth: Mucous membranes are moist.  Eyes:     Extraocular Movements: Extraocular movements intact.     Conjunctiva/sclera: Conjunctivae normal.     Pupils: Pupils are equal, round, and reactive to light.  Cardiovascular:     Rate and Rhythm: Normal rate and regular rhythm.     Pulses: Normal pulses.     Heart sounds: Normal heart sounds.  Pulmonary:     Effort: Pulmonary effort is normal.     Breath sounds: Normal breath sounds.  Abdominal:     General: Abdomen is flat. Bowel sounds are normal.     Palpations: Abdomen is soft.  Musculoskeletal:        General: Normal range of motion.     Cervical back: Normal range of motion.  Skin:    General: Skin is warm and dry.  Neurological:     General: No focal deficit present.     Mental Status: She is alert and oriented to person, place, and time.  Psychiatric:        Mood and Affect: Mood normal.        Behavior: Behavior normal.        Thought Content: Thought content normal.        Judgment: Judgment normal.      No results found for any visits on 06/23/23.  Recent Results (from  the past 2160 hours)  Lipid panel     Status: None   Collection Time: 05/02/23 10:24 AM  Result Value Ref Range   Cholesterol, Total 144 100 - 199 mg/dL   Triglycerides 869 0 - 149 mg/dL   HDL 43 >60 mg/dL   VLDL Cholesterol Cal 23 5 - 40 mg/dL  LDL Chol Calc (NIH) 78 0 - 99 mg/dL   Chol/HDL Ratio 3.3 0.0 - 4.4 ratio    Comment:                                   T. Chol/HDL Ratio                                             Men  Women                               1/2 Avg.Risk  3.4    3.3                                   Avg.Risk  5.0    4.4                                2X Avg.Risk  9.6    7.1                                3X Avg.Risk 23.4   11.0   VITAMIN D  25 Hydroxy (Vit-D Deficiency, Fractures)     Status: None   Collection Time: 05/02/23 10:24 AM  Result Value Ref Range   Vit D, 25-Hydroxy 56.6 30.0 - 100.0 ng/mL    Comment: Vitamin D  deficiency has been defined by the Institute of Medicine and an Endocrine Society practice guideline as a level of serum 25-OH vitamin D  less than 20 ng/mL (1,2). The Endocrine Society went on to further define vitamin D  insufficiency as a level between 21 and 29 ng/mL (2). 1. IOM (Institute of Medicine). 2010. Dietary reference    intakes for calcium and D. [place]: The    [employer]. 2. Holick MF, Binkley [place], Bischoff-Ferrari HA, et al.    Evaluation, treatment, and prevention of vitamin D     deficiency: an Endocrine Society clinical practice    guideline. JCEM. 2011 Jul; 96(7):1911-30.   CMP14+EGFR     Status: Abnormal   Collection Time: 05/02/23 10:24 AM  Result Value Ref Range   Glucose 77 70 - 99 mg/dL   BUN 8 8 - 27 mg/dL   Creatinine, Ser 9.10 0.57 - 1.00 mg/dL   eGFR 71 >40 fO/fpw/8.26   BUN/Creatinine Ratio 9 (L) 12 - 28   Sodium 141 134 - 144 mmol/L   Potassium 4.2 3.5 - 5.2 mmol/L   Chloride 101 96 - 106 mmol/L   CO2 27 20 - 29 mmol/L   Calcium 9.6 8.7 - 10.3 mg/dL   Total Protein 7.0 6.0 - 8.5 g/dL    Albumin 3.9 3.9 - 4.9 g/dL   Globulin, Total 3.1 1.5 - 4.5 g/dL   Bilirubin Total 0.7 0.0 - 1.2 mg/dL   Alkaline Phosphatase 82 44 - 121 IU/L   AST 19 0 - 40 IU/L   ALT 14 0 - 32 IU/L  TSH     Status: Abnormal   Collection Time: 05/02/23 10:24 AM  Result Value Ref Range   TSH 4.630 (H)  0.450 - 4.500 uIU/mL  Hemoglobin A1c     Status: Abnormal   Collection Time: 05/02/23 10:24 AM  Result Value Ref Range   Hgb A1c MFr Bld 5.8 (H) 4.8 - 5.6 %    Comment:          Prediabetes: 5.7 - 6.4          Diabetes: >6.4          Glycemic control for adults with diabetes: <7.0    Est. average glucose Bld gHb Est-mCnc 120 mg/dL  Vitamin B12     Status: None   Collection Time: 05/02/23 10:24 AM  Result Value Ref Range   Vitamin B-12 479 232 - 1,245 pg/mL  CBC with Diff     Status: None   Collection Time: 05/02/23 10:24 AM  Result Value Ref Range   WBC 6.7 3.4 - 10.8 x10E3/uL   RBC 4.57 3.77 - 5.28 x10E6/uL   Hemoglobin 12.8 11.1 - 15.9 g/dL   Hematocrit 59.9 65.9 - 46.6 %   MCV 88 79 - 97 fL   MCH 28.0 26.6 - 33.0 pg   MCHC 32.0 31.5 - 35.7 g/dL   RDW 86.5 88.2 - 84.5 %   Platelets 222 150 - 450 x10E3/uL   Neutrophils 64 Not Estab. %   Lymphs 25 Not Estab. %   Monocytes 7 Not Estab. %   Eos 4 Not Estab. %   Basos 0 Not Estab. %   Neutrophils Absolute 4.3 1.4 - 7.0 x10E3/uL   Lymphocytes Absolute 1.7 0.7 - 3.1 x10E3/uL   Monocytes Absolute 0.5 0.1 - 0.9 x10E3/uL   EOS (ABSOLUTE) 0.3 0.0 - 0.4 x10E3/uL   Basophils Absolute 0.0 0.0 - 0.2 x10E3/uL   Immature Granulocytes 0 Not Estab. %   Immature Grans (Abs) 0.0 0.0 - 0.1 x10E3/uL  T4     Status: None   Collection Time: 05/02/23 10:24 AM  Result Value Ref Range   T4, Total 5.7 4.5 - 12.0 ug/dL  T3     Status: None   Collection Time: 05/02/23 10:24 AM  Result Value Ref Range   T3, Total 120 71 - 180 ng/dL  Specimen status report     Status: None   Collection Time: 05/02/23 10:24 AM  Result Value Ref Range   specimen status  report Comment     Comment: Written Authorization Written Authorization Written Authorization Received. Authorization received from Alan Arrant  for [employer] on 05-04-2023 Logged by Frankey Pepper   POC CREATINE & ALBUMIN,URINE     Status: Abnormal   Collection Time: 05/02/23 11:54 AM  Result Value Ref Range   Microalbumin Ur, POC 150 mg/L   Creatinine, POC 300 mg/dL   Albumin/Creatinine Ratio, Urine, POC 30-300       Assessment & Plan:  Augmentin , tessalon  pearls and Flonase  sent to the pharmacy.  Drink plenty of water.   Problem List Items Addressed This Visit       Respiratory   Acute URI - Primary    Return if symptoms worsen or fail to improve.   Total time spent: 25 minutes  [employer], NP  06/23/2023   This document may have been prepared by Dragon Voice Recognition software and as such may include unintentional dictation errors.

## 2023-06-28 ENCOUNTER — Telehealth: Payer: Self-pay | Admitting: Family

## 2023-06-28 NOTE — Telephone Encounter (Signed)
 Patient left VM that she seen Amber the other day and she is almost done with the meds she sent in but she is still sick. Can we send her in something else? Please advise.

## 2023-07-02 DIAGNOSIS — J0120 Acute ethmoidal sinusitis, unspecified: Secondary | ICD-10-CM | POA: Diagnosis not present

## 2023-07-06 ENCOUNTER — Ambulatory Visit (INDEPENDENT_AMBULATORY_CARE_PROVIDER_SITE_OTHER): Payer: Medicare Other | Admitting: Family

## 2023-07-06 ENCOUNTER — Encounter: Payer: Self-pay | Admitting: Family

## 2023-07-06 VITALS — BP 132/84 | HR 83 | Temp 98.2°F | Ht 65.0 in | Wt 254.4 lb

## 2023-07-06 DIAGNOSIS — J069 Acute upper respiratory infection, unspecified: Secondary | ICD-10-CM | POA: Diagnosis not present

## 2023-07-06 LAB — POCT XPERT XPRESS SARS COVID-2/FLU/RSV
FLU A: NEGATIVE
FLU B: NEGATIVE
RSV RNA, PCR: NEGATIVE
SARS Coronavirus 2: NEGATIVE

## 2023-07-06 LAB — POCT RAPID STREP A (OFFICE): Rapid Strep A Screen: NEGATIVE

## 2023-07-06 MED ORDER — LEVOFLOXACIN 750 MG PO TABS: 750.0000 mg | ORAL_TABLET | Freq: Every day | ORAL | 0 refills | Status: DC

## 2023-07-06 NOTE — Progress Notes (Signed)
Acute Office Visit  Subjective:     Patient ID: Natalie Cuevas, female    DOB: 1955-07-03, 68 y.o.   MRN: [id number]  Patient is in today for  Chief Complaint  Patient presents with   Sore Throat    Sore Throat  This is a new problem. The current episode started 1 to 4 weeks ago. The problem has been gradually worsening. Neither side of throat is experiencing more pain than the other. There has been no fever. The pain is at a severity of 10/10. The pain is severe. Associated symptoms include congestion, coughing, ear pain, a hoarse voice and trouble swallowing. She has had no exposure to strep or mono. She has tried acetaminophen, cool liquids, NSAIDs and gargles for the symptoms. The treatment provided no relief.     Review of Systems  HENT:  Positive for congestion, ear pain, hoarse voice and trouble swallowing.   Respiratory:  Positive for cough.   All other systems reviewed and are negative.       Objective:    BP 132/84   Pulse 83   Temp 98.2 F (36.8 C) (Tympanic)   Ht 5\' 5"  (1.651 m)   Wt 254 lb 6.4 oz (115.4 kg)   SpO2 97%   BMI 42.33 kg/m   Physical Exam Vitals and nursing note reviewed.  Constitutional:      General: She is not in acute distress.    Appearance: She is well-developed. She is obese. She is ill-appearing.  HENT:     Nose: Congestion and rhinorrhea present.     Mouth/Throat:     Pharynx: Pharyngeal swelling and posterior oropharyngeal erythema present.     Tonsils: Tonsillar exudate present. 1+ on the right. 1+ on the left.  Cardiovascular:     Rate and Rhythm: Normal rate and regular rhythm.  Pulmonary:     Breath sounds: Wheezing present.  Lymphadenopathy:     Cervical: Cervical adenopathy present.  Neurological:     General: No focal deficit present.     Mental Status: She is alert and oriented to person, place, and time.  Psychiatric:        Mood and Affect: Mood normal.        Behavior: Behavior normal.     Results for  orders placed or performed in visit on 07/06/23  POCT rapid strep A  Result Value Ref Range   Rapid Strep A Screen Negative Negative  POCT XPERT XPRESS SARS COVID-2/FLU/RSV  Result Value Ref Range   SARS Coronavirus 2 neg    FLU A neg    FLU B neg    RSV RNA, PCR neg     Recent Results (from the past 2160 hours)  Lipid panel     Status: None   Collection Time: 05/02/23 10:24 AM  Result Value Ref Range   Cholesterol, Total 144 100 - 199 mg/dL   Triglycerides 829 0 - 149 mg/dL   HDL 43 >56 mg/dL   VLDL Cholesterol Cal 23 5 - 40 mg/dL   LDL Chol Calc (NIH) 78 0 - 99 mg/dL   Chol/HDL Ratio 3.3 0.0 - 4.4 ratio    Comment:                                   T. Chol/HDL Ratio  Men  Women                               1/2 Avg.Risk  3.4    3.3                                   Avg.Risk  5.0    4.4                                2X Avg.Risk  9.6    7.1                                3X Avg.Risk 23.4   11.0   VITAMIN D 25 Hydroxy (Vit-D Deficiency, Fractures)     Status: None   Collection Time: 05/02/23 10:24 AM  Result Value Ref Range   Vit D, 25-Hydroxy 56.6 30.0 - 100.0 ng/mL    Comment: Vitamin D deficiency has been defined by the Institute of Medicine and an Endocrine Society practice guideline as a level of serum 25-OH vitamin D less than 20 ng/mL (1,2). The Endocrine Society went on to further define vitamin D insufficiency as a level between 21 and 29 ng/mL (2). 1. IOM (Institute of Medicine). 2010. Dietary reference    intakes for calcium and D. [place]: The    [employer]. 2. Holick MF, Binkley [place], Bischoff-Ferrari HA, et al.    Evaluation, treatment, and prevention of vitamin D    deficiency: an Endocrine Society clinical practice    guideline. JCEM. 2011 Jul; 96(7):1911-30.   CMP14+EGFR     Status: Abnormal   Collection Time: 05/02/23 10:24 AM  Result Value Ref Range   Glucose 77 70 - 99 mg/dL   BUN 8 8 -  27 mg/dL   Creatinine, Ser 7.82 0.57 - 1.00 mg/dL   eGFR 71 >95 AO/ZHY/8.65   BUN/Creatinine Ratio 9 (L) 12 - 28   Sodium 141 134 - 144 mmol/L   Potassium 4.2 3.5 - 5.2 mmol/L   Chloride 101 96 - 106 mmol/L   CO2 27 20 - 29 mmol/L   Calcium 9.6 8.7 - 10.3 mg/dL   Total Protein 7.0 6.0 - 8.5 g/dL   Albumin 3.9 3.9 - 4.9 g/dL   Globulin, Total 3.1 1.5 - 4.5 g/dL   Bilirubin Total 0.7 0.0 - 1.2 mg/dL   Alkaline Phosphatase 82 44 - 121 IU/L   AST 19 0 - 40 IU/L   ALT 14 0 - 32 IU/L  TSH     Status: Abnormal   Collection Time: 05/02/23 10:24 AM  Result Value Ref Range   TSH 4.630 (H) 0.450 - 4.500 uIU/mL  Hemoglobin A1c     Status: Abnormal   Collection Time: 05/02/23 10:24 AM  Result Value Ref Range   Hgb A1c MFr Bld 5.8 (H) 4.8 - 5.6 %    Comment:          Prediabetes: 5.7 - 6.4          Diabetes: >6.4          Glycemic control for adults with diabetes: <7.0    Est. average glucose Bld gHb Est-mCnc 120 mg/dL  Vitamin H84     Status: None  Collection Time: 05/02/23 10:24 AM  Result Value Ref Range   Vitamin B-12 479 232 - 1,245 pg/mL  CBC with Diff     Status: None   Collection Time: 05/02/23 10:24 AM  Result Value Ref Range   WBC 6.7 3.4 - 10.8 x10E3/uL   RBC 4.57 3.77 - 5.28 x10E6/uL   Hemoglobin 12.8 11.1 - 15.9 g/dL   Hematocrit 95.2 84.1 - 46.6 %   MCV 88 79 - 97 fL   MCH 28.0 26.6 - 33.0 pg   MCHC 32.0 31.5 - 35.7 g/dL   RDW 32.4 40.1 - 02.7 %   Platelets 222 150 - 450 x10E3/uL   Neutrophils 64 Not Estab. %   Lymphs 25 Not Estab. %   Monocytes 7 Not Estab. %   Eos 4 Not Estab. %   Basos 0 Not Estab. %   Neutrophils Absolute 4.3 1.4 - 7.0 x10E3/uL   Lymphocytes Absolute 1.7 0.7 - 3.1 x10E3/uL   Monocytes Absolute 0.5 0.1 - 0.9 x10E3/uL   EOS (ABSOLUTE) 0.3 0.0 - 0.4 x10E3/uL   Basophils Absolute 0.0 0.0 - 0.2 x10E3/uL   Immature Granulocytes 0 Not Estab. %   Immature Grans (Abs) 0.0 0.0 - 0.1 x10E3/uL  T4     Status: None   Collection Time: 05/02/23 10:24  AM  Result Value Ref Range   T4, Total 5.7 4.5 - 12.0 ug/dL  T3     Status: None   Collection Time: 05/02/23 10:24 AM  Result Value Ref Range   T3, Total 120 71 - 180 ng/dL  Specimen status report     Status: None   Collection Time: 05/02/23 10:24 AM  Result Value Ref Range   specimen status report Comment     Comment: Written Authorization Written Authorization Written Authorization Received. Authorization received from [employer]  for [employer] on 05-04-2023 Logged by Margaretann Loveless   POC CREATINE & ALBUMIN,URINE     Status: Abnormal   Collection Time: 05/02/23 11:54 AM  Result Value Ref Range   Microalbumin Ur, POC 150 mg/L   Creatinine, POC 300 mg/dL   Albumin/Creatinine Ratio, Urine, POC 30-300   POCT rapid strep A     Status: None   Collection Time: 07/06/23  4:14 PM  Result Value Ref Range   Rapid Strep A Screen Negative Negative  POCT XPERT XPRESS SARS COVID-2/FLU/RSV     Status: None   Collection Time: 07/06/23  4:14 PM  Result Value Ref Range   SARS Coronavirus 2 neg    FLU A neg    FLU B neg    RSV RNA, PCR neg     Allergies as of 07/06/2023       Reactions   Codeine         Medication List        Accurate as of July 06, 2023  5:01 PM. If you have any questions, ask your nurse or doctor.          aspirin 81 MG chewable tablet Chew by mouth daily.   azithromycin 250 MG tablet Commonly known as: ZITHROMAX Take by mouth daily.   benzonatate 100 MG capsule Commonly known as: Tessalon Perles Take 1 capsule (100 mg total) by mouth 3 (three) times daily as needed for cough.   fluticasone 50 MCG/ACT nasal spray Commonly known as: FLONASE Place 1 spray into both nostrils daily.   levofloxacin 750 MG tablet Commonly known as: LEVAQUIN Take 1 tablet (750 mg total) by  mouth daily. Started by: Miki Kins   lisinopril 20 MG tablet Commonly known as: ZESTRIL TAKE 1 TABLET BY MOUTH ONCE  DAILY   rosuvastatin 10 MG  tablet Commonly known as: CRESTOR TAKE 1 TABLET BY MOUTH ONCE  DAILY FOR CHOLESTEROL   Vitamin D (Ergocalciferol) 1.25 MG (50000 UNIT) Caps capsule Commonly known as: DRISDOL TAKE 1 CAPSULE BY MOUTH ONCE  WEEKLY            Assessment & Plan:   Problem List Items Addressed This Visit       Respiratory   Acute URI - Primary   Relevant Medications   azithromycin (ZITHROMAX) 250 MG tablet   Other Relevant Orders   POCT rapid strep A (Completed)   POCT XPERT XPRESS SARS COVID-2/FLU/RSV (Completed)   Given that patient has already tried multiple medications, will send levaquin for her.  Asked her to let me know if this is not improving symptoms by next week.   Return as previously scheduled.  Total time spent: 20 minutes  Miki Kins, FNP  07/06/2023   This document may have been prepared by [facility] Voice Recognition software and as such may include unintentional dictation errors.

## 2023-07-07 NOTE — Telephone Encounter (Signed)
Patient was seen yesterday and has gotten a new medication called in

## 2023-07-26 ENCOUNTER — Other Ambulatory Visit: Payer: Self-pay | Admitting: Family

## 2023-07-27 ENCOUNTER — Other Ambulatory Visit: Payer: Self-pay | Admitting: Family

## 2023-08-21 ENCOUNTER — Ambulatory Visit (INDEPENDENT_AMBULATORY_CARE_PROVIDER_SITE_OTHER): Payer: Medicare Other | Admitting: Family

## 2023-08-21 ENCOUNTER — Encounter: Payer: Self-pay | Admitting: Family

## 2023-08-21 VITALS — BP 132/84 | HR 63 | Ht 65.0 in | Wt 258.6 lb

## 2023-08-21 DIAGNOSIS — E1165 Type 2 diabetes mellitus with hyperglycemia: Secondary | ICD-10-CM

## 2023-08-21 DIAGNOSIS — I10 Essential (primary) hypertension: Secondary | ICD-10-CM | POA: Diagnosis not present

## 2023-08-21 DIAGNOSIS — E559 Vitamin D deficiency, unspecified: Secondary | ICD-10-CM | POA: Diagnosis not present

## 2023-08-21 DIAGNOSIS — E538 Deficiency of other specified B group vitamins: Secondary | ICD-10-CM | POA: Diagnosis not present

## 2023-08-21 DIAGNOSIS — R5383 Other fatigue: Secondary | ICD-10-CM | POA: Diagnosis not present

## 2023-08-21 DIAGNOSIS — E782 Mixed hyperlipidemia: Secondary | ICD-10-CM

## 2023-08-21 MED ORDER — NYSTATIN 100000 UNIT/GM EX POWD: 1.0000 | Freq: Three times a day (TID) | CUTANEOUS | 0 refills | Status: AC

## 2023-08-21 MED ORDER — FEXOFENADINE HCL 180 MG PO TABS: 180.0000 mg | ORAL_TABLET | Freq: Every day | ORAL | 1 refills | Status: AC

## 2023-08-21 NOTE — Assessment & Plan Note (Signed)
 Checking labs today. Will call pt. With results  Continue current diabetes POC, as patient has been well controlled on current regimen.  Will adjust meds if needed based on labs.

## 2023-08-21 NOTE — Assessment & Plan Note (Signed)
 Checking labs today.  Continue current therapy for lipid control. Will modify as needed based on labwork results.

## 2023-08-21 NOTE — Assessment & Plan Note (Signed)
 Blood pressure well controlled with current medications.  Continue current therapy.  Will reassess at follow up.

## 2023-08-21 NOTE — Assessment & Plan Note (Signed)
 Continue current meds.  Will adjust as needed based on results.  The patient is asked to make an attempt to improve diet and exercise patterns to aid in medical management of this problem. Addressed importance of increasing and maintaining water intake.

## 2023-08-21 NOTE — Assessment & Plan Note (Signed)
 Checking labs today.  Will continue supplements as needed.

## 2023-08-21 NOTE — Progress Notes (Signed)
 Established Patient Office Visit  Subjective:  Patient ID: Natalie Cuevas, female    DOB: 1955-11-29  Age: 68 y.o. MRN: [id number]  Chief Complaint  Patient presents with   Follow-up    3 month follow up, discuss lab results.    Patient is here today for her 3 months follow up.  She has been feeling well since last appointment.   She does not have additional concerns to discuss today.  Patient is feeling much better than she was last month at her appt.   Labs are due today. She needs refills.   I have reviewed her active problem list, medication list, allergies, notes from last encounter, lab results for her appointment today.      No other concerns at this time.   Past Medical History:  Diagnosis Date   Hypertension    Tooth abscess 12/01/2014    Past Surgical History:  Procedure Laterality Date   ABDOMINAL HYSTERECTOMY     APPENDECTOMY     CHOLECYSTECTOMY     TONSILLECTOMY      Social History   Socioeconomic History   Marital status: Single    Spouse name: Not on file   Number of children: Not on file   Years of education: Not on file   Highest education level: Not on file  Occupational History   Not on file  Tobacco Use   Smoking status: Former    Current packs/day: 0.25    Types: Cigarettes   Smokeless tobacco: Never  Vaping Use   Vaping status: Never Used  Substance and Sexual Activity   Alcohol use: No   Drug use: No    Types: "Crack" cocaine   Sexual activity: Not Currently  Other Topics Concern   Not on file  Social History Narrative   Not on file   Social Drivers of Health   Financial Resource Strain: Low Risk  (06/17/2023)   Overall Financial Resource Strain (CARDIA)    Difficulty of Paying Living Expenses: Not hard at all  Food Insecurity: No Food Insecurity (06/07/2023)   Hunger Vital Sign    Worried About Running Out of Food in the Last Year: Never true    Ran Out of Food in the Last Year: Never true  Transportation  Needs: No Transportation Needs (06/07/2023)   PRAPARE - Administrator, Civil Service (Medical): No    Lack of Transportation (Non-Medical): No  Physical Activity: Insufficiently Active (06/07/2023)   Exercise Vital Sign    Days of Exercise per Week: 4 days    Minutes of Exercise per Session: 30 min  Stress: No Stress Concern Present (06/07/2023)   [employer] of Occupational Health - Occupational Stress Questionnaire    Feeling of Stress : Not at all  Social Connections: Not on file  Intimate Partner Violence: Not At Risk (06/07/2023)   Humiliation, Afraid, Rape, and Kick questionnaire    Fear of Current or Ex-Partner: No    Emotionally Abused: No    Physically Abused: No    Sexually Abused: No    Family History  Problem Relation Age of Onset   Hypertension Mother    Huntington's disease Father    Breast cancer Neg Hx     Allergies  Allergen Reactions   Codeine     Review of Systems  All other systems reviewed and are negative.      Objective:   BP 132/84   Pulse 63   Ht 5\' 5"  (1.651 m)  Wt 258 lb 9.6 oz (117.3 kg)   SpO2 99%   BMI 43.03 kg/m   Vitals:   08/21/23 1005  BP: 132/84  Pulse: 63  Height: 5\' 5"  (1.651 m)  Weight: 258 lb 9.6 oz (117.3 kg)  SpO2: 99%  BMI (Calculated): 43.03    Physical Exam Vitals and nursing note reviewed.  Constitutional:      Appearance: Normal appearance. She is normal weight.  HENT:     Head: Normocephalic.  Eyes:     Extraocular Movements: Extraocular movements intact.     Conjunctiva/sclera: Conjunctivae normal.     Pupils: Pupils are equal, round, and reactive to light.  Cardiovascular:     Rate and Rhythm: Normal rate.  Pulmonary:     Effort: Pulmonary effort is normal.  Neurological:     General: No focal deficit present.     Mental Status: She is alert and oriented to person, place, and time. Mental status is at baseline.  Psychiatric:        Mood and Affect: Mood normal.         Behavior: Behavior normal.        Thought Content: Thought content normal.        Judgment: Judgment normal.      No results found for any visits on 08/21/23.  Recent Results (from the past 2160 hours)  POCT rapid strep A     Status: None   Collection Time: 07/06/23  4:14 PM  Result Value Ref Range   Rapid Strep A Screen Negative Negative  POCT XPERT XPRESS SARS COVID-2/FLU/RSV     Status: None   Collection Time: 07/06/23  4:14 PM  Result Value Ref Range   SARS Coronavirus 2 neg    FLU A neg    FLU B neg    RSV RNA, PCR neg        Assessment & Plan:   Problem List Items Addressed This Visit       Cardiovascular and Mediastinum   Hypertension   Blood pressure well controlled with current medications.  Continue current therapy.  Will reassess at follow up.          Endocrine   Type 2 diabetes mellitus with hyperglycemia, without long-term current use of insulin (HCC)   Checking labs today. Will call pt. With results  Continue current diabetes POC, as patient has been well controlled on current regimen.  Will adjust meds if needed based on labs.        Relevant Orders   CMP14+EGFR   Hemoglobin A1c   CBC with Diff     Other   Hyperlipidemia, mixed   Checking labs today.  Continue current therapy for lipid control. Will modify as needed based on labwork results.        Relevant Orders   Lipid panel   CMP14+EGFR   CBC with Diff   Vitamin D deficiency - Primary   Checking labs today.  Will continue supplements as needed.        Relevant Orders   VITAMIN D 25 Hydroxy (Vit-D Deficiency, Fractures)   CMP14+EGFR   CBC with Diff   Morbid obesity (HCC)   Continue current meds.  Will adjust as needed based on results.  The patient is asked to make an attempt to improve diet and exercise patterns to aid in medical management of this problem. Addressed importance of increasing and maintaining water intake.        Relevant Orders   CMP14+EGFR  CBC  with Diff   B12 deficiency due to diet   Checking labs today.  Will continue supplements as needed.       Relevant Orders   CMP14+EGFR   Vitamin B12   CBC with Diff   Other Visit Diagnoses       Other fatigue       Relevant Orders   CMP14+EGFR   TSH   CBC with Diff       Return in about 3 months (around 11/21/2023).   Total time spent: 20 minutes  Miki Kins, FNP  08/21/2023   This document may have been prepared by [facility] Voice Recognition software and as such may include unintentional dictation errors.

## 2023-08-22 LAB — CBC WITH DIFFERENTIAL/PLATELET
Basophils Absolute: 0 10*3/uL (ref 0.0–0.2)
Basos: 0 %
EOS (ABSOLUTE): 0.3 10*3/uL (ref 0.0–0.4)
Eos: 3 %
Hematocrit: 40.1 % (ref 34.0–46.6)
Hemoglobin: 12.7 g/dL (ref 11.1–15.9)
Immature Grans (Abs): 0 10*3/uL (ref 0.0–0.1)
Immature Granulocytes: 0 %
Lymphocytes Absolute: 2.6 10*3/uL (ref 0.7–3.1)
Lymphs: 34 %
MCH: 27.4 pg (ref 26.6–33.0)
MCHC: 31.7 g/dL (ref 31.5–35.7)
MCV: 87 fL (ref 79–97)
Monocytes Absolute: 0.6 10*3/uL (ref 0.1–0.9)
Monocytes: 8 %
Neutrophils Absolute: 4.1 10*3/uL (ref 1.4–7.0)
Neutrophils: 55 %
Platelets: 245 10*3/uL (ref 150–450)
RBC: 4.63 x10E6/uL (ref 3.77–5.28)
RDW: 13.8 % (ref 11.7–15.4)
WBC: 7.6 10*3/uL (ref 3.4–10.8)

## 2023-08-22 LAB — VITAMIN D 25 HYDROXY (VIT D DEFICIENCY, FRACTURES): Vit D, 25-Hydroxy: 49.5 ng/mL (ref 30.0–100.0)

## 2023-08-22 LAB — CMP14+EGFR
ALT: 19 IU/L (ref 0–32)
AST: 26 IU/L (ref 0–40)
Albumin: 4 g/dL (ref 3.9–4.9)
Alkaline Phosphatase: 80 IU/L (ref 44–121)
BUN/Creatinine Ratio: 13 (ref 12–28)
BUN: 12 mg/dL (ref 8–27)
Bilirubin Total: 0.8 mg/dL (ref 0.0–1.2)
CO2: 26 mmol/L (ref 20–29)
Calcium: 9.3 mg/dL (ref 8.7–10.3)
Chloride: 103 mmol/L (ref 96–106)
Creatinine, Ser: 0.89 mg/dL (ref 0.57–1.00)
Globulin, Total: 3.4 g/dL (ref 1.5–4.5)
Glucose: 85 mg/dL (ref 70–99)
Potassium: 4.3 mmol/L (ref 3.5–5.2)
Sodium: 140 mmol/L (ref 134–144)
Total Protein: 7.4 g/dL (ref 6.0–8.5)
eGFR: 70 mL/min/{1.73_m2} (ref >59)

## 2023-08-22 LAB — LIPID PANEL
Chol/HDL Ratio: 3 ratio (ref 0.0–4.4)
Cholesterol, Total: 140 mg/dL (ref 100–199)
HDL: 46 mg/dL (ref >39)
LDL Chol Calc (NIH): 73 mg/dL (ref 0–99)
Triglycerides: 117 mg/dL (ref 0–149)
VLDL Cholesterol Cal: 21 mg/dL (ref 5–40)

## 2023-08-22 LAB — VITAMIN B12: Vitamin B-12: 472 pg/mL (ref 232–1245)

## 2023-08-22 LAB — TSH: TSH: 4.73 u[IU]/mL — ABNORMAL HIGH (ref 0.450–4.500)

## 2023-08-22 LAB — HEMOGLOBIN A1C
Est. average glucose Bld gHb Est-mCnc: 131 mg/dL
Hgb A1c MFr Bld: 6.2 % — ABNORMAL HIGH (ref 4.8–5.6)

## 2023-09-14 ENCOUNTER — Telehealth: Payer: Self-pay

## 2023-09-14 NOTE — Telephone Encounter (Signed)
 Patient informed she needs to come get her thyroid rechecked

## 2023-09-14 NOTE — Telephone Encounter (Signed)
 Pt LM asking for call back ,she mentioned her A1C  being high but in some result notes she asked for her TSH to be rechecked.

## 2023-09-15 ENCOUNTER — Other Ambulatory Visit

## 2023-09-15 DIAGNOSIS — I10 Essential (primary) hypertension: Secondary | ICD-10-CM

## 2023-09-16 LAB — TSH+T4F+T3FREE
Free T4: 0.88 ng/dL (ref 0.82–1.77)
T3, Free: 2.8 pg/mL (ref 2.0–4.4)
TSH: 4.53 u[IU]/mL — ABNORMAL HIGH (ref 0.450–4.500)

## 2023-10-23 ENCOUNTER — Telehealth: Payer: Self-pay | Admitting: Family

## 2023-10-23 NOTE — Telephone Encounter (Signed)
 Patient left VM needing an Rx for Mounjaro  sent in. Took her last sample shot already. Please send Rx.

## 2023-10-24 ENCOUNTER — Other Ambulatory Visit: Payer: Self-pay

## 2023-10-24 MED ORDER — MOUNJARO 5 MG/0.5ML ~~LOC~~ SOAJ: 5.0000 mg | SUBCUTANEOUS | 1 refills | Status: DC

## 2023-11-21 ENCOUNTER — Ambulatory Visit: Admitting: Family

## 2023-11-27 ENCOUNTER — Ambulatory Visit (INDEPENDENT_AMBULATORY_CARE_PROVIDER_SITE_OTHER): Admitting: Family

## 2023-11-27 ENCOUNTER — Encounter: Payer: Self-pay | Admitting: Family

## 2023-11-27 VITALS — BP 128/82 | HR 81 | Ht 65.0 in | Wt 236.4 lb

## 2023-11-27 DIAGNOSIS — I10 Essential (primary) hypertension: Secondary | ICD-10-CM

## 2023-11-27 DIAGNOSIS — R5383 Other fatigue: Secondary | ICD-10-CM

## 2023-11-27 DIAGNOSIS — E1165 Type 2 diabetes mellitus with hyperglycemia: Secondary | ICD-10-CM | POA: Diagnosis not present

## 2023-11-27 DIAGNOSIS — E559 Vitamin D deficiency, unspecified: Secondary | ICD-10-CM

## 2023-11-27 DIAGNOSIS — B351 Tinea unguium: Secondary | ICD-10-CM

## 2023-11-27 DIAGNOSIS — E538 Deficiency of other specified B group vitamins: Secondary | ICD-10-CM

## 2023-11-27 DIAGNOSIS — E782 Mixed hyperlipidemia: Secondary | ICD-10-CM

## 2023-11-27 MED ORDER — TERBINAFINE HCL 250 MG PO TABS: 250.0000 mg | ORAL_TABLET | Freq: Every day | ORAL | 2 refills | Status: DC

## 2023-11-27 NOTE — Assessment & Plan Note (Signed)
 Blood pressure well controlled with current medications.  Continue current therapy.  Will reassess at follow up.

## 2023-11-27 NOTE — Progress Notes (Signed)
 Established Patient Office Visit  Subjective:  Patient ID: Natalie Cuevas, female    DOB: 1956-04-07  Age: 68 y.o. MRN: [id number]  Chief Complaint  Patient presents with   Follow-up    3 month follow up    Patient is here today for her 3 months follow up.  She has been feeling fairly well since last appointment.   She does have additional concerns to discuss today.  Asks if there are any other treatments for her toenail, as she has continued to have issues with this despite us  trying the ciclopirox for her previously.   Labs are not due today. She needs refills.   I have reviewed her active problem list, medication list, allergies, notes from last encounter, lab results for her appointment today.      No other concerns at this time.   Past Medical History:  Diagnosis Date   Hypertension    Tooth abscess 12/01/2014    Past Surgical History:  Procedure Laterality Date   ABDOMINAL HYSTERECTOMY     APPENDECTOMY     CHOLECYSTECTOMY     TONSILLECTOMY      Social History   Socioeconomic History   Marital status: Single    Spouse name: Not on file   Number of children: Not on file   Years of education: Not on file   Highest education level: Not on file  Occupational History   Not on file  Tobacco Use   Smoking status: Former    Current packs/day: 0.25    Types: Cigarettes   Smokeless tobacco: Never  Vaping Use   Vaping status: Never Used  Substance and Sexual Activity   Alcohol use: No   Drug use: No    Types: "Crack" cocaine   Sexual activity: Not Currently  Other Topics Concern   Not on file  Social History Narrative   Not on file   Social Drivers of Health   Financial Resource Strain: Low Risk  (06/17/2023)   Overall Financial Resource Strain (CARDIA)    Difficulty of Paying Living Expenses: Not hard at all  Food Insecurity: No Food Insecurity (06/07/2023)   Hunger Vital Sign    Worried About Running Out of Food in the Last Year: Never true     Ran Out of Food in the Last Year: Never true  Transportation Needs: No Transportation Needs (06/07/2023)   PRAPARE - Administrator, Civil Service (Medical): No    Lack of Transportation (Non-Medical): No  Physical Activity: Insufficiently Active (06/07/2023)   Exercise Vital Sign    Days of Exercise per Week: 4 days    Minutes of Exercise per Session: 30 min  Stress: No Stress Concern Present (06/07/2023)   [employer] of Occupational Health - Occupational Stress Questionnaire    Feeling of Stress : Not at all  Social Connections: Not on file  Intimate Partner Violence: Not At Risk (06/07/2023)   Humiliation, Afraid, Rape, and Kick questionnaire    Fear of Current or Ex-Partner: No    Emotionally Abused: No    Physically Abused: No    Sexually Abused: No    Family History  Problem Relation Age of Onset   Hypertension Mother    Huntington's disease Father    Breast cancer Neg Hx     Allergies  Allergen Reactions   Codeine     Review of Systems  All other systems reviewed and are negative.      Objective:   BP  128/82   Pulse 81   Ht 5\' 5"  (1.651 m)   Wt 236 lb 6.4 oz (107.2 kg)   SpO2 97%   BMI 39.34 kg/m   Vitals:   11/27/23 0909 11/27/23 0935  BP: 138/86 128/82  Pulse: 81   Height: 5\' 5"  (1.651 m)   Weight: 236 lb 6.4 oz (107.2 kg)   SpO2: 97%   BMI (Calculated): 39.34     Physical Exam Vitals and nursing note reviewed.  Constitutional:      Appearance: Normal appearance. She is normal weight.  HENT:     Head: Normocephalic and atraumatic.  Eyes:     Extraocular Movements: Extraocular movements intact.     Conjunctiva/sclera: Conjunctivae normal.     Pupils: Pupils are equal, round, and reactive to light.  Cardiovascular:     Rate and Rhythm: Normal rate and regular rhythm.     Pulses: Normal pulses.     Heart sounds: Normal heart sounds.  Pulmonary:     Effort: Pulmonary effort is normal.     Breath sounds: Normal  breath sounds.  Musculoskeletal:        General: Normal range of motion.     Cervical back: Normal range of motion.  Neurological:     General: No focal deficit present.     Mental Status: She is alert and oriented to person, place, and time. Mental status is at baseline.  Psychiatric:        Mood and Affect: Mood normal.        Behavior: Behavior normal.        Thought Content: Thought content normal.      No results found for any visits on 11/27/23.  Recent Results (from the past 2160 hours)  TSH+T4F+T3Free     Status: Abnormal   Collection Time: 09/15/23  1:43 PM  Result Value Ref Range   TSH 4.530 (H) 0.450 - 4.500 uIU/mL   T3, Free 2.8 2.0 - 4.4 pg/mL   Free T4 0.88 0.82 - 1.77 ng/dL       Assessment & Plan:   Problem List Items Addressed This Visit       Cardiovascular and Mediastinum   Hypertension - Primary   Blood pressure well controlled with current medications.  Continue current therapy.  Will reassess at follow up.          Endocrine   Type 2 diabetes mellitus with hyperglycemia, without long-term current use of insulin (HCC)   Checking labs today. Will call pt. With results  Continue current diabetes POC, as patient has been well controlled on current regimen.  Will adjust meds if needed based on labs.        Relevant Orders   CMP14+EGFR   CBC with Diff   Hemoglobin A1c     Other   Hyperlipidemia, mixed   Checking labs today.  Continue current therapy for lipid control. Will modify as needed based on labwork results.        Relevant Orders   CMP14+EGFR   Lipid panel   CBC with Diff   Vitamin D  deficiency   Checking labs today.  Will continue supplements as needed.        Relevant Orders   CMP14+EGFR   VITAMIN D  25 Hydroxy (Vit-D Deficiency, Fractures)   CBC with Diff   Morbid obesity (HCC)   Continue current meds.  Will adjust as needed based on results.  The patient is asked to make an attempt to improve diet  and exercise  patterns to aid in medical management of this problem. Addressed importance of increasing and maintaining water intake.        Relevant Orders   CMP14+EGFR   CBC with Diff   B12 deficiency due to diet   Checking labs today.  Will continue supplements as needed.       Relevant Orders   CMP14+EGFR   Vitamin B12   CBC with Diff   Other Visit Diagnoses       Other fatigue       Relevant Orders   CMP14+EGFR   CBC with Diff   TSH     Onychomycosis       Sending Terbinafine rx for patient.  WIll reassess at follow up.   Relevant Medications   terbinafine (LAMISIL) 250 MG tablet       Return in about 3 months (around 02/27/2024).   Total time spent: 20 minutes  Trenda Frisk, FNP  11/27/2023   This document may have been prepared by [facility] Voice Recognition software and as such may include unintentional dictation errors.

## 2023-11-27 NOTE — Patient Instructions (Signed)
 [facility] [address], [address] [place],  [place]  [address]  Main: [phone]

## 2023-11-27 NOTE — Assessment & Plan Note (Signed)
 Checking labs today.  Continue current therapy for lipid control. Will modify as needed based on labwork results.

## 2023-11-27 NOTE — Assessment & Plan Note (Signed)
 Checking labs today.  Will continue supplements as needed.

## 2023-11-27 NOTE — Assessment & Plan Note (Signed)
 Checking labs today. Will call pt. With results  Continue current diabetes POC, as patient has been well controlled on current regimen.  Will adjust meds if needed based on labs.

## 2023-11-27 NOTE — Assessment & Plan Note (Signed)
 Continue current meds.  Will adjust as needed based on results.  The patient is asked to make an attempt to improve diet and exercise patterns to aid in medical management of this problem. Addressed importance of increasing and maintaining water intake.

## 2023-11-28 LAB — CBC WITH DIFFERENTIAL/PLATELET
Basophils Absolute: 0 10*3/uL (ref 0.0–0.2)
Basos: 0 %
EOS (ABSOLUTE): 0.1 10*3/uL (ref 0.0–0.4)
Eos: 2 %
Hematocrit: 43.3 % (ref 34.0–46.6)
Hemoglobin: 13.7 g/dL (ref 11.1–15.9)
Immature Grans (Abs): 0 10*3/uL (ref 0.0–0.1)
Immature Granulocytes: 0 %
Lymphocytes Absolute: 1.7 10*3/uL (ref 0.7–3.1)
Lymphs: 24 %
MCH: 28.1 pg (ref 26.6–33.0)
MCHC: 31.6 g/dL (ref 31.5–35.7)
MCV: 89 fL (ref 79–97)
Monocytes Absolute: 0.5 10*3/uL (ref 0.1–0.9)
Monocytes: 8 %
Neutrophils Absolute: 4.6 10*3/uL (ref 1.4–7.0)
Neutrophils: 66 %
Platelets: 208 10*3/uL (ref 150–450)
RBC: 4.88 x10E6/uL (ref 3.77–5.28)
RDW: 13.3 % (ref 11.7–15.4)
WBC: 7 10*3/uL (ref 3.4–10.8)

## 2023-11-28 LAB — CMP14+EGFR
ALT: 24 IU/L (ref 0–32)
AST: 28 IU/L (ref 0–40)
Albumin: 4.1 g/dL (ref 3.9–4.9)
Alkaline Phosphatase: 79 IU/L (ref 44–121)
BUN/Creatinine Ratio: 9 — ABNORMAL LOW (ref 12–28)
BUN: 9 mg/dL (ref 8–27)
Bilirubin Total: 1.3 mg/dL — ABNORMAL HIGH (ref 0.0–1.2)
CO2: 22 mmol/L (ref 20–29)
Calcium: 9.9 mg/dL (ref 8.7–10.3)
Chloride: 98 mmol/L (ref 96–106)
Creatinine, Ser: 0.95 mg/dL (ref 0.57–1.00)
Globulin, Total: 3.1 g/dL (ref 1.5–4.5)
Glucose: 77 mg/dL (ref 70–99)
Potassium: 4 mmol/L (ref 3.5–5.2)
Sodium: 136 mmol/L (ref 134–144)
Total Protein: 7.2 g/dL (ref 6.0–8.5)
eGFR: 65 mL/min/{1.73_m2} (ref >59)

## 2023-11-28 LAB — VITAMIN D 25 HYDROXY (VIT D DEFICIENCY, FRACTURES): Vit D, 25-Hydroxy: 78.2 ng/mL (ref 30.0–100.0)

## 2023-11-28 LAB — LIPID PANEL
Chol/HDL Ratio: 2.5 ratio (ref 0.0–4.4)
Cholesterol, Total: 106 mg/dL (ref 100–199)
HDL: 43 mg/dL (ref >39)
LDL Chol Calc (NIH): 45 mg/dL (ref 0–99)
Triglycerides: 91 mg/dL (ref 0–149)
VLDL Cholesterol Cal: 18 mg/dL (ref 5–40)

## 2023-11-28 LAB — TSH: TSH: 3.77 u[IU]/mL (ref 0.450–4.500)

## 2023-11-28 LAB — VITAMIN B12: Vitamin B-12: 490 pg/mL (ref 232–1245)

## 2023-11-28 LAB — HEMOGLOBIN A1C
Est. average glucose Bld gHb Est-mCnc: 105 mg/dL
Hgb A1c MFr Bld: 5.3 % (ref 4.8–5.6)

## 2023-12-01 ENCOUNTER — Ambulatory Visit: Payer: Self-pay

## 2023-12-04 ENCOUNTER — Other Ambulatory Visit: Payer: Self-pay

## 2023-12-04 MED ORDER — MOUNJARO 7.5 MG/0.5ML ~~LOC~~ SOAJ: 7.5000 mg | SUBCUTANEOUS | 1 refills | Status: DC

## 2024-01-29 ENCOUNTER — Other Ambulatory Visit: Payer: Self-pay | Admitting: Family

## 2024-02-27 ENCOUNTER — Other Ambulatory Visit: Payer: Self-pay

## 2024-02-27 ENCOUNTER — Emergency Department

## 2024-02-27 ENCOUNTER — Encounter: Payer: Self-pay | Admitting: *Deleted

## 2024-02-27 ENCOUNTER — Ambulatory Visit (INDEPENDENT_AMBULATORY_CARE_PROVIDER_SITE_OTHER): Admitting: Family

## 2024-02-27 ENCOUNTER — Emergency Department: Admission: EM | Admit: 2024-02-27 | Discharge: 2024-02-27 | Disposition: A | Discharge status: Home / Self Care

## 2024-02-27 ENCOUNTER — Encounter: Payer: Self-pay | Admitting: Family

## 2024-02-27 VITALS — BP 108/78 | HR 56 | Ht 65.0 in | Wt 215.2 lb

## 2024-02-27 DIAGNOSIS — R5383 Other fatigue: Secondary | ICD-10-CM

## 2024-02-27 DIAGNOSIS — I10 Essential (primary) hypertension: Secondary | ICD-10-CM | POA: Insufficient documentation

## 2024-02-27 DIAGNOSIS — R300 Dysuria: Secondary | ICD-10-CM | POA: Diagnosis not present

## 2024-02-27 DIAGNOSIS — N281 Cyst of kidney, acquired: Secondary | ICD-10-CM | POA: Diagnosis not present

## 2024-02-27 DIAGNOSIS — Z1231 Encounter for screening mammogram for malignant neoplasm of breast: Secondary | ICD-10-CM

## 2024-02-27 DIAGNOSIS — E782 Mixed hyperlipidemia: Secondary | ICD-10-CM | POA: Diagnosis not present

## 2024-02-27 DIAGNOSIS — E538 Deficiency of other specified B group vitamins: Secondary | ICD-10-CM

## 2024-02-27 DIAGNOSIS — E1165 Type 2 diabetes mellitus with hyperglycemia: Secondary | ICD-10-CM | POA: Diagnosis not present

## 2024-02-27 DIAGNOSIS — R109 Unspecified abdominal pain: Secondary | ICD-10-CM | POA: Diagnosis not present

## 2024-02-27 DIAGNOSIS — Z1211 Encounter for screening for malignant neoplasm of colon: Secondary | ICD-10-CM

## 2024-02-27 DIAGNOSIS — E559 Vitamin D deficiency, unspecified: Secondary | ICD-10-CM | POA: Diagnosis not present

## 2024-02-27 DIAGNOSIS — K7689 Other specified diseases of liver: Secondary | ICD-10-CM | POA: Diagnosis not present

## 2024-02-27 DIAGNOSIS — R1012 Left upper quadrant pain: Secondary | ICD-10-CM | POA: Insufficient documentation

## 2024-02-27 DIAGNOSIS — R319 Hematuria, unspecified: Secondary | ICD-10-CM | POA: Insufficient documentation

## 2024-02-27 DIAGNOSIS — Z0130 Encounter for examination of blood pressure without abnormal findings: Secondary | ICD-10-CM

## 2024-02-27 LAB — CBC
HCT: 40.4 % (ref 36.0–46.0)
Hemoglobin: 13.1 g/dL (ref 12.0–15.0)
MCH: 28 pg (ref 26.0–34.0)
MCHC: 32.4 g/dL (ref 30.0–36.0)
MCV: 86.3 fL (ref 80.0–100.0)
Platelets: 184 K/uL (ref 150–400)
RBC: 4.68 MIL/uL (ref 3.87–5.11)
RDW: 14.3 % (ref 11.5–15.5)
WBC: 10.9 K/uL — ABNORMAL HIGH (ref 4.0–10.5)
nRBC: 0 % (ref 0.0–0.2)

## 2024-02-27 LAB — COMPREHENSIVE METABOLIC PANEL WITH GFR
ALT: 21 U/L (ref 0–44)
AST: 28 U/L (ref 15–41)
Albumin: 3.6 g/dL (ref 3.5–5.0)
Alkaline Phosphatase: 58 U/L (ref 38–126)
Anion gap: 9 (ref 5–15)
BUN: 16 mg/dL (ref 8–23)
CO2: 25 mmol/L (ref 22–32)
Calcium: 9.2 mg/dL (ref 8.9–10.3)
Chloride: 105 mmol/L (ref 98–111)
Creatinine, Ser: 1.1 mg/dL — ABNORMAL HIGH (ref 0.44–1.00)
GFR, Estimated: 54 mL/min — ABNORMAL LOW (ref >60)
Glucose, Bld: 117 mg/dL — ABNORMAL HIGH (ref 70–99)
Potassium: 3.8 mmol/L (ref 3.5–5.1)
Sodium: 139 mmol/L (ref 135–145)
Total Bilirubin: 0.8 mg/dL (ref 0.0–1.2)
Total Protein: 7.5 g/dL (ref 6.5–8.1)

## 2024-02-27 LAB — URINALYSIS, ROUTINE W REFLEX MICROSCOPIC
Bilirubin Urine: NEGATIVE
Glucose, UA: NEGATIVE mg/dL
Ketones, ur: NEGATIVE mg/dL
Leukocytes,Ua: NEGATIVE
Nitrite: NEGATIVE
Protein, ur: 30 mg/dL — AB
RBC / HPF: >50 RBC/hpf (ref 0–5)
Specific Gravity, Urine: 1.017 (ref 1.005–1.030)
pH: 5 (ref 5.0–8.0)

## 2024-02-27 MED ORDER — HYDROMORPHONE HCL 1 MG/ML IJ SOLN
0.5000 mg | Freq: Once | INTRAMUSCULAR | Status: AC
  Administered 2024-02-27: 0.5 mg via INTRAVENOUS
  Filled 2024-02-27: qty 0.5

## 2024-02-27 MED ORDER — KETOROLAC TROMETHAMINE 15 MG/ML IJ SOLN
15.0000 mg | Freq: Once | INTRAMUSCULAR | Status: AC
  Administered 2024-02-27: 15 mg via INTRAVENOUS
  Filled 2024-02-27: qty 1

## 2024-02-27 MED ORDER — MOUNJARO 7.5 MG/0.5ML ~~LOC~~ SOAJ: 7.5000 mg | SUBCUTANEOUS | 2 refills | Status: DC

## 2024-02-27 MED ORDER — TERBINAFINE HCL 250 MG PO TABS: 250.0000 mg | ORAL_TABLET | Freq: Every day | ORAL | 2 refills | Status: AC

## 2024-02-27 MED ORDER — SODIUM CHLORIDE 0.9 % IV BOLUS
1000.0000 mL | Freq: Once | INTRAVENOUS | Status: AC
  Administered 2024-02-27: 1000 mL via INTRAVENOUS

## 2024-02-27 MED ORDER — ONDANSETRON HCL 4 MG/2ML IJ SOLN
4.0000 mg | Freq: Once | INTRAMUSCULAR | Status: AC
  Administered 2024-02-27: 4 mg via INTRAVENOUS
  Filled 2024-02-27: qty 2

## 2024-02-27 NOTE — Assessment & Plan Note (Signed)
 Checking labs today.  Continue current therapy for lipid control. Will modify as needed based on labwork results.   -CMP w/eGFR -Lipid Panel

## 2024-02-27 NOTE — ED Provider Notes (Signed)
 [facility] Provider Note    Event Date/Time   First MD Initiated Contact with Patient 02/27/24 2135     (approximate)   History   Flank Pain   HPI  Natalie Cuevas is a 68 y.o. female hypertension, cholecystectomy, appendage ectomy, hysterectomy and nephrolithiasis who presents to the emergency department with 10 hours of left flank pain and left upper abdominal pain.  Patient states that pain started suddenly this morning and is associated with pain with urination.  She denies any changes in bowel habits and has not had any nausea or vomiting.  She has not noticed any hematuria.  She states that pain is similar to prior episode of nephrolithiasis.  She has never required intervention.  She presents with her daughter who contributes to the history      Physical Exam   Triage Vital Signs: ED Triage Vitals  Encounter Vitals Group     BP 02/27/24 2123 127/84     Girls Systolic BP Percentile --      Girls Diastolic BP Percentile --      Boys Systolic BP Percentile --      Boys Diastolic BP Percentile --      Pulse Rate 02/27/24 2123 (!) 111     Resp 02/27/24 2123 18     Temp 02/27/24 2123 98.2 F (36.8 C)     Temp Source 02/27/24 2123 Oral     SpO2 02/27/24 2123 95 %     Weight 02/27/24 2121 215 lb (97.5 kg)     Height 02/27/24 2121 5' 5 (1.651 m)     Head Circumference --      Peak Flow --      Pain Score 02/27/24 2120 8     Pain Loc --      Pain Education --      Exclude from Growth Chart --     Most recent vital signs: Vitals:   02/27/24 2123 02/27/24 2205  BP: 127/84 124/86  Pulse: (!) 111 88  Resp: 18   Temp: 98.2 F (36.8 C)   SpO2: 95% 96%    Nursing Triage Note reviewed. Vital signs reviewed and patients oxygen saturation is normoxic  General: Patient is well nourished, well developed, awake and alert, appears uncomfortable Head: Normocephalic and atraumatic Eyes: Normal inspection, extraocular muscles intact, no  conjunctival pallor Ear, nose, throat: Normal external exam Neck: Normal range of motion Respiratory: Patient is in no respiratory distress, lungs CTAB Cardiovascular: Patient is not tachycardic, RRR without murmur appreciated GI: Abd soft, tender to palpation in left flank and left abdomen Back: Normal inspection of the back with good strength and range of motion throughout all ext Extremities: pulses intact with good cap refills, no LE pitting edema or calf tenderness Neuro: The patient is alert and oriented to person, place, and time, appropriately conversive, with 5/5 bilat UE/LE strength, no gross motor or sensory defects noted. Coordination appears to be adequate. Skin: Warm, dry, and intact Psych: normal mood and affect, no SI or HI  ED Results / Procedures / Treatments   Labs (all labs ordered are listed, but only abnormal results are displayed) Labs Reviewed  COMPREHENSIVE METABOLIC PANEL WITH GFR - Abnormal; Notable for the following components:      Result Value   Glucose, Bld 117 (*)    Creatinine, Ser 1.10 (*)    GFR, Estimated 54 (*)    All other components within normal limits  CBC - Abnormal; Notable  for the following components:   WBC 10.9 (*)    All other components within normal limits  URINALYSIS, ROUTINE W REFLEX MICROSCOPIC - Abnormal; Notable for the following components:   Color, Urine YELLOW (*)    APPearance HAZY (*)    Hgb urine dipstick LARGE (*)    Protein, ur 30 (*)    Bacteria, UA RARE (*)    All other components within normal limits     EKG   RADIOLOGY CT abd and pelvis: No evidence of nephrolithiasis on my independent review interpretation radiologist agrees    PROCEDURES:  Critical Care performed: No  Procedures   MEDICATIONS ORDERED IN ED: Medications  ketorolac  (TORADOL ) 15 MG/ML injection 15 mg (15 mg Intravenous Given 02/27/24 2159)  ondansetron  (ZOFRAN ) injection 4 mg (4 mg Intravenous Given 02/27/24 2159)  sodium chloride  0.9  % bolus 1,000 mL (1,000 mLs Intravenous New Bag/Given 02/27/24 2157)  HYDROmorphone  (DILAUDID ) injection 0.5 mg (0.5 mg Intravenous Given 02/27/24 2159)     IMPRESSION / MDM / ASSESSMENT AND PLAN / ED COURSE                                Differential diagnosis includes, but is not limited to, kidney stone, UTI, acute renal insufficiency, electrolyte derangement, perinephric abscess  ED course: Patient presents and appears uncomfortable.  Multimodal pain control initiated including IV fluids, Toradol , Dilaudid  and Zofran .  This did seem to resolve the patient's symptoms.  CT renal demonstrated no acute abnormality the patient was asymptomatic at this time.  Urinalysis did demonstrate blood in the urine and I do wonder whether she passed the stone.  She had no profound electrolyte derangements or acute renal insufficiency.  She was counseled on a kidney stone diet and will follow-up with her primary care physician.  All questions answered and patient voiced understanding and requested discharge  At time of discharge there is no evidence of acute life, limb, vision, or fertility threat. Patient has stable vital signs, pain is well controlled, patient is ambulatory and p.o. tolerant.  Discharge instructions were completed using the [employer] system. I would refer you to those at this time. All warnings prescriptions follow-up etc. were discussed in detail with the patient. Patient indicates understanding and is agreeable with this plan. All questions answered.  Patient is made aware that they may return to the emergency department for any worsening or new condition or for any other emergency.    Clinical Course as of 02/27/24 2345  Tue Feb 27, 2024  2226 I reviewed the CT imaging with the patient who felt reassured.  She reports that she is currently pain-free.  We are waiting for urinalysis and she understands the need to collect at [HD]  2338 Urinalysis, Routine w reflex microscopic -(!) No signs  of UTI but did have a large amount of RBCs possibly consistent with past stone [HD]  2340 I reviewed return precautions with the patient.  She feels much improved.  She will follow-up with her primary care physician for repeat urinalysis next week to ensure the blood goes away.  All questions answered and patient and daughter voiced understanding.  Daughter will be driving the patient home [HD]    Clinical Course User Index [HD] Nicholaus Rolland BRAVO, MD   -- Risk: 5 This patient has a high risk of morbidity due to further diagnostic testing or treatment. Rationale: This patient's evaluation and management involve a high  risk of morbidity due to the potential severity of presenting symptoms, need for diagnostic testing, and/or initiation of treatment that may require close monitoring. The differential includes conditions with potential for significant deterioration or requiring escalation of care. Treatment decisions in the ED, including medication administration, procedural interventions, or disposition planning, reflect this level of risk. COPA: 5 The patient has the following acute or chronic illness/injury that poses a possible threat to life or bodily function: [X] : The patient has a potentially serious acute condition or an acute exacerbation of a chronic illness requiring urgent evaluation and management in the Emergency Department. The clinical presentation necessitates immediate consideration of life-threatening or function-threatening diagnoses, even if they are ultimately ruled out.   FINAL CLINICAL IMPRESSION(S) / ED DIAGNOSES   Final diagnoses:  Dysuria  Flank pain  Left sided abdominal pain  Hematuria, unspecified type     Rx / DC Orders   ED Discharge Orders     None        Note:  This document was prepared using Dragon voice recognition software and may include unintentional dictation errors.   Nicholaus Rolland BRAVO, MD 02/27/24 [phone]

## 2024-02-27 NOTE — ED Notes (Signed)
 Patient given discharge instructions including importance of dietary changes and follow up appt as needed with stated understanding. INT removed, cannula intact, pressure dressing applied. Patient stable and ambulatory with steady even gait on dispo.

## 2024-02-27 NOTE — ED Notes (Signed)
Pt ambulatory to toilet to attempt to give urine sample

## 2024-02-27 NOTE — ED Notes (Signed)
 Pt states unable to provided urine sample at this time and will ring call light when able.

## 2024-02-27 NOTE — Progress Notes (Signed)
 Established Patient Office Visit  Subjective:  Patient ID: Natalie Cuevas, female    DOB: February 04, 1956  Age: 68 y.o. MRN: [id number]  Chief Complaint  Patient presents with   Follow-up    3 month follow up    Patient is here today for her 3 months follow up.  She has been feeling fairly well since last appointment.   She does have additional concerns to discuss today.  Labs are due today.  She needs refills.   I have reviewed her active problem list, medication list, allergies, health maintenance, notes from last encounter, lab results for her appointment today.    No other concerns at this time.   Past Medical History:  Diagnosis Date   Hypertension    Tooth abscess 12/01/2014    Past Surgical History:  Procedure Laterality Date   ABDOMINAL HYSTERECTOMY     APPENDECTOMY     CHOLECYSTECTOMY     TONSILLECTOMY      Social History   Socioeconomic History   Marital status: Single    Spouse name: Not on file   Number of children: Not on file   Years of education: Not on file   Highest education level: Not on file  Occupational History   Not on file  Tobacco Use   Smoking status: Former    Current packs/day: 0.25    Types: Cigarettes   Smokeless tobacco: Never  Vaping Use   Vaping status: Never Used  Substance and Sexual Activity   Alcohol use: No   Drug use: No    Types: Crack cocaine   Sexual activity: Not Currently  Other Topics Concern   Not on file  Social History Narrative   Not on file   Social Drivers of Health   Financial Resource Strain: Low Risk  (06/17/2023)   Overall Financial Resource Strain (CARDIA)    Difficulty of Paying Living Expenses: Not hard at all  Food Insecurity: No Food Insecurity (06/07/2023)   Hunger Vital Sign    Worried About Running Out of Food in the Last Year: Never true    Ran Out of Food in the Last Year: Never true  Transportation Needs: No Transportation Needs (06/07/2023)   PRAPARE - Therapist, art (Medical): No    Lack of Transportation (Non-Medical): No  Physical Activity: Insufficiently Active (06/07/2023)   Exercise Vital Sign    Days of Exercise per Week: 4 days    Minutes of Exercise per Session: 30 min  Stress: No Stress Concern Present (06/07/2023)   [employer] of Occupational Health - Occupational Stress Questionnaire    Feeling of Stress : Not at all  Social Connections: Not on file  Intimate Partner Violence: Not At Risk (06/07/2023)   Humiliation, Afraid, Rape, and Kick questionnaire    Fear of Current or Ex-Partner: No    Emotionally Abused: No    Physically Abused: No    Sexually Abused: No    Family History  Problem Relation Age of Onset   Hypertension Mother    Huntington's disease Father    Breast cancer Neg Hx     Allergies  Allergen Reactions   Codeine     Review of Systems  All other systems reviewed and are negative.      Objective:   BP 108/78   Pulse (!) 56   Ht 5' 5 (1.651 m)   Wt 215 lb 3.2 oz (97.6 kg)   SpO2 98%   BMI  35.81 kg/m   Vitals:   02/27/24 0857  BP: 108/78  Pulse: (!) 56  Height: 5' 5 (1.651 m)  Weight: 215 lb 3.2 oz (97.6 kg)  SpO2: 98%  BMI (Calculated): 35.81    Physical Exam Vitals and nursing note reviewed.  Constitutional:      Appearance: Normal appearance. She is normal weight.  HENT:     Head: Normocephalic.  Eyes:     Extraocular Movements: Extraocular movements intact.     Conjunctiva/sclera: Conjunctivae normal.     Pupils: Pupils are equal, round, and reactive to light.  Cardiovascular:     Rate and Rhythm: Normal rate.  Pulmonary:     Effort: Pulmonary effort is normal.  Neurological:     General: No focal deficit present.     Mental Status: She is alert and oriented to person, place, and time. Mental status is at baseline.  Psychiatric:        Mood and Affect: Mood normal.        Behavior: Behavior normal.        Thought Content: Thought content normal.       No results found for any visits on 02/27/24.  No results found for this or any previous visit (from the past 2160 hours).     Assessment & Plan Morbid obesity (HCC) Continue current meds.  Will adjust as needed based on results.  The patient is asked to make an attempt to improve diet and exercise patterns to aid in medical management of this problem. Addressed importance of increasing and maintaining water intake.   Screening for colon cancer Cologuard order placed today.  Pt is aware of instructions.   Screening mammogram for breast cancer Mammogram ordered today.  Pt aware they will call to get her scheduled.   Vitamin D  deficiency B12 deficiency due to diet Other fatigue Checking labs today.  Will continue supplements as needed.   - Vitamin D  - Vitamin B12 - TSH  Hyperlipidemia, mixed Checking labs today.  Continue current therapy for lipid control. Will modify as needed based on labwork results.   -CMP w/eGFR -Lipid Panel  Type 2 diabetes mellitus with hyperglycemia, without long-term current use of insulin (HCC) Checking labs today. Will call pt. With results  Continue current diabetes POC, as patient has been well controlled on current regimen.  Will adjust meds if needed based on labs.   -CBC w/Diff -CMP w/eGFR -Hemoglobin A1C     Return in about 3 months (around 05/28/2024) for AWV.   Total time spent: 20 minutes  ALAN CHRISTELLA ARRANT, FNP  02/27/2024   This document may have been prepared by [facility] Voice Recognition software and as such may include unintentional dictation errors.

## 2024-02-27 NOTE — Assessment & Plan Note (Signed)
 Checking labs today.  Will continue supplements as needed.   - Vitamin D  - Vitamin B12 - TSH

## 2024-02-27 NOTE — Assessment & Plan Note (Signed)
 Continue current meds.  Will adjust as needed based on results.  The patient is asked to make an attempt to improve diet and exercise patterns to aid in medical management of this problem. Addressed importance of increasing and maintaining water  intake.

## 2024-02-27 NOTE — Assessment & Plan Note (Deleted)
 Continue current meds.  Will adjust as needed based on results.  The patient is asked to make an attempt to improve diet and exercise patterns to aid in medical management of this problem. Addressed importance of increasing and maintaining water  intake.

## 2024-02-27 NOTE — ED Triage Notes (Signed)
 Pt ambulatory to triage.  Pt has left flank pain.  Sx for 1 day.  Hx kidney stones.  Pt has dysuria.  Pt alert   speech clear.

## 2024-02-27 NOTE — Assessment & Plan Note (Signed)
 Checking labs today. Will call pt. With results  Continue current diabetes POC, as patient has been well controlled on current regimen.  Will adjust meds if needed based on labs.   -CBC w/Diff -CMP w/eGFR -Hemoglobin A1C

## 2024-02-27 NOTE — Discharge Instructions (Signed)
 You was seen in our emergency department for 1 day of flank and abdominal pain.  Workup demonstrated no acute abnormality except for some blood in your urine.  I suspect you likely passed the stone prior to CT imaging.  We need to make sure that the blood in your urine clears up after a week so please talk to your primary care physician about reordering her urinalysis in 1 to 2 weeks time.  For the next 24 hours please ensure adequate hydration (your urine should be clear).  Please avoid any colas or high calcium foods.  Return with any acutely worsening symptoms or any other emergency.  It was very nice meeting you and I wish you the best of luck -- RETURN PRECAUTIONS & AFTERCARE: (ENGLISH) RETURN PRECAUTIONS: Return immediately to the emergency department or see/call your doctor if you feel worse, weak or have changes in speech or vision, are short of breath, have fever, vomiting, pain, bleeding or dark stool, trouble urinating or any new issues. Return here or see/call your doctor if not improving as expected for your suspected condition. FOLLOW-UP CARE: Call your doctor and/or any doctors we referred you to for more advice and to make an appointment. Do this today, tomorrow or after the weekend. Some doctors only take PPO insurance so if you have HMO insurance you may want to contact your HMO or your regular doctor for referral to a specialist within your plan. Either way tell the doctor's office that it was a referral from the emergency department so you get the soonest possible appointment.  YOUR TEST RESULTS: Take result reports of any blood or urine tests, imaging tests and EKG's to your doctor and any referral doctor. Have any abnormal tests repeated. Your doctor or a referral doctor can let you know when this should be done. Also make sure your doctor contacts this hospital to get any test results that are not currently available such as cultures or special tests for infection and final imaging  reports, which are often not available at the time you leave the ER but which may list additional important findings that are not documented on the preliminary report. BLOOD PRESSURE: If your blood pressure was greater than 120/80 have your blood pressure rechecked within 1 to 2 weeks. MEDICATION SIDE EFFECTS: Do not drive, walk, bike, take the bus, etc. if you have received or are being prescribed any sedating medications such as those for pain or anxiety or certain antihistamines like Benadryl. If you have been give one of these here get a taxi home or have a friend drive you home. Ask your pharmacist to counsel you on potential side effects of any new medication

## 2024-02-28 LAB — CBC WITH DIFFERENTIAL/PLATELET
Basophils Absolute: 0 x10E3/uL (ref 0.0–0.2)
Basos: 0 %
EOS (ABSOLUTE): 0.1 x10E3/uL (ref 0.0–0.4)
Eos: 2 %
Hematocrit: 43.6 % (ref 34.0–46.6)
Hemoglobin: 13.1 g/dL (ref 11.1–15.9)
Immature Grans (Abs): 0 x10E3/uL (ref 0.0–0.1)
Immature Granulocytes: 0 %
Lymphocytes Absolute: 1.8 x10E3/uL (ref 0.7–3.1)
Lymphs: 27 %
MCH: 26.8 pg (ref 26.6–33.0)
MCHC: 30 g/dL — ABNORMAL LOW (ref 31.5–35.7)
MCV: 89 fL (ref 79–97)
Monocytes Absolute: 0.4 x10E3/uL (ref 0.1–0.9)
Monocytes: 5 %
Neutrophils Absolute: 4.4 x10E3/uL (ref 1.4–7.0)
Neutrophils: 66 %
Platelets: 213 x10E3/uL (ref 150–450)
RBC: 4.88 x10E6/uL (ref 3.77–5.28)
RDW: 13.6 % (ref 11.7–15.4)
WBC: 6.7 x10E3/uL (ref 3.4–10.8)

## 2024-02-28 LAB — CMP14+EGFR
ALT: 20 IU/L (ref 0–32)
AST: 24 IU/L (ref 0–40)
Albumin: 4.2 g/dL (ref 3.9–4.9)
Alkaline Phosphatase: 72 IU/L (ref 44–121)
BUN/Creatinine Ratio: 12 (ref 12–28)
BUN: 12 mg/dL (ref 8–27)
Bilirubin Total: 0.7 mg/dL (ref 0.0–1.2)
CO2: 25 mmol/L (ref 20–29)
Calcium: 9.5 mg/dL (ref 8.7–10.3)
Chloride: 100 mmol/L (ref 96–106)
Creatinine, Ser: 1.02 mg/dL — ABNORMAL HIGH (ref 0.57–1.00)
Globulin, Total: 3.1 g/dL (ref 1.5–4.5)
Glucose: 82 mg/dL (ref 70–99)
Potassium: 4 mmol/L (ref 3.5–5.2)
Sodium: 139 mmol/L (ref 134–144)
Total Protein: 7.3 g/dL (ref 6.0–8.5)
eGFR: 60 mL/min/1.73 (ref >59)

## 2024-02-28 LAB — HEMOGLOBIN A1C
Est. average glucose Bld gHb Est-mCnc: 100 mg/dL
Hgb A1c MFr Bld: 5.1 % (ref 4.8–5.6)

## 2024-02-28 LAB — VITAMIN B12: Vitamin B-12: 384 pg/mL (ref 232–1245)

## 2024-02-28 LAB — LIPID PANEL
Chol/HDL Ratio: 2.8 ratio (ref 0.0–4.4)
Cholesterol, Total: 127 mg/dL (ref 100–199)
HDL: 45 mg/dL (ref >39)
LDL Chol Calc (NIH): 62 mg/dL (ref 0–99)
Triglycerides: 107 mg/dL (ref 0–149)
VLDL Cholesterol Cal: 20 mg/dL (ref 5–40)

## 2024-02-28 LAB — TSH: TSH: 4.03 u[IU]/mL (ref 0.450–4.500)

## 2024-02-28 LAB — VITAMIN D 25 HYDROXY (VIT D DEFICIENCY, FRACTURES): Vit D, 25-Hydroxy: 75 ng/mL (ref 30.0–100.0)

## 2024-03-06 ENCOUNTER — Ambulatory Visit (INDEPENDENT_AMBULATORY_CARE_PROVIDER_SITE_OTHER): Admitting: Family

## 2024-03-06 DIAGNOSIS — R300 Dysuria: Secondary | ICD-10-CM | POA: Diagnosis not present

## 2024-03-06 DIAGNOSIS — Z87442 Personal history of urinary calculi: Secondary | ICD-10-CM

## 2024-03-06 NOTE — Progress Notes (Signed)
   CHIEF COMPLAINT  UA/ only visit fot UTI     REASON FOR VISIT  Possible UTI, UA Visit Only      ASSESSMENT & PLAN Diagnoses and all orders for this visit:  Dysuria -     Cancel: POCT Urinalysis Dipstick (18997)  History of kidney stones -     Urinalysis, Routine w reflex microscopic     Patient notified.  Total time spent: 5 minutes  ALAN CHRISTELLA ARRANT, FNP 03/06/2024

## 2024-03-07 ENCOUNTER — Ambulatory Visit: Payer: Self-pay

## 2024-03-07 LAB — URINALYSIS, ROUTINE W REFLEX MICROSCOPIC
Bilirubin, UA: NEGATIVE
Glucose, UA: NEGATIVE
Ketones, UA: NEGATIVE
Nitrite, UA: NEGATIVE
RBC, UA: NEGATIVE
Specific Gravity, UA: 1.014 (ref 1.005–1.030)
Urobilinogen, Ur: 0.2 mg/dL (ref 0.2–1.0)
pH, UA: 6.5 (ref 5.0–7.5)

## 2024-03-07 LAB — MICROSCOPIC EXAMINATION
Casts: NONE SEEN /LPF
Epithelial Cells (non renal): >10 /HPF — AB (ref 0–10)
RBC, Urine: NONE SEEN /HPF (ref 0–2)

## 2024-03-11 ENCOUNTER — Ambulatory Visit (INDEPENDENT_AMBULATORY_CARE_PROVIDER_SITE_OTHER): Admitting: Family

## 2024-03-11 DIAGNOSIS — R300 Dysuria: Secondary | ICD-10-CM

## 2024-03-11 LAB — POCT URINALYSIS DIPSTICK
Bilirubin, UA: NEGATIVE
Blood, UA: NEGATIVE
Glucose, UA: NEGATIVE
Ketones, UA: NEGATIVE
Nitrite, UA: NEGATIVE
Protein, UA: NEGATIVE
Spec Grav, UA: 1.01 (ref 1.010–1.025)
Urobilinogen, UA: 0.2 U/dL
pH, UA: 6.5 (ref 5.0–8.0)

## 2024-03-11 NOTE — Progress Notes (Signed)
   CHIEF COMPLAINT  UA/ only visit fot UTI     REASON FOR VISIT  Possible UTI, UA Visit Only      ASSESSMENT & PLAN Diagnoses and all orders for this visit:  Dysuria -     POCT Urinalysis Dipstick (18997) -     Urinalysis, Routine w reflex microscopic -     Urine Culture     Patient notified.  Total time spent: 5 minutes  ALAN CHRISTELLA ARRANT, FNP 03/11/2024

## 2024-03-12 LAB — URINALYSIS, ROUTINE W REFLEX MICROSCOPIC
Bilirubin, UA: NEGATIVE
Glucose, UA: NEGATIVE
Ketones, UA: NEGATIVE
Nitrite, UA: NEGATIVE
Protein,UA: NEGATIVE
RBC, UA: NEGATIVE
Specific Gravity, UA: 1.006 (ref 1.005–1.030)
Urobilinogen, Ur: 0.2 mg/dL (ref 0.2–1.0)
pH, UA: 6.5 (ref 5.0–7.5)

## 2024-03-12 LAB — MICROSCOPIC EXAMINATION
Casts: NONE SEEN /LPF
RBC, Urine: NONE SEEN /HPF (ref 0–2)

## 2024-03-13 LAB — URINE CULTURE

## 2024-03-18 ENCOUNTER — Ambulatory Visit: Payer: Self-pay

## 2024-03-19 ENCOUNTER — Other Ambulatory Visit: Payer: Self-pay | Admitting: Family

## 2024-05-24 ENCOUNTER — Ambulatory Visit: Payer: Self-pay

## 2024-05-28 ENCOUNTER — Ambulatory Visit: Admitting: Family

## 2024-05-29 ENCOUNTER — Ambulatory Visit (INDEPENDENT_AMBULATORY_CARE_PROVIDER_SITE_OTHER): Admitting: Family

## 2024-05-29 ENCOUNTER — Encounter: Payer: Self-pay | Admitting: Family

## 2024-05-29 VITALS — BP 130/82 | HR 78 | Ht 65.0 in | Wt 202.2 lb

## 2024-05-29 DIAGNOSIS — E538 Deficiency of other specified B group vitamins: Secondary | ICD-10-CM

## 2024-05-29 DIAGNOSIS — K219 Gastro-esophageal reflux disease without esophagitis: Secondary | ICD-10-CM | POA: Insufficient documentation

## 2024-05-29 DIAGNOSIS — E1169 Type 2 diabetes mellitus with other specified complication: Secondary | ICD-10-CM | POA: Diagnosis not present

## 2024-05-29 DIAGNOSIS — E6609 Other obesity due to excess calories: Secondary | ICD-10-CM

## 2024-05-29 DIAGNOSIS — Z1211 Encounter for screening for malignant neoplasm of colon: Secondary | ICD-10-CM | POA: Insufficient documentation

## 2024-05-29 DIAGNOSIS — E782 Mixed hyperlipidemia: Secondary | ICD-10-CM | POA: Diagnosis not present

## 2024-05-29 DIAGNOSIS — Z6833 Body mass index (BMI) 33.0-33.9, adult: Secondary | ICD-10-CM | POA: Diagnosis not present

## 2024-05-29 DIAGNOSIS — E559 Vitamin D deficiency, unspecified: Secondary | ICD-10-CM

## 2024-05-29 DIAGNOSIS — Z1231 Encounter for screening mammogram for malignant neoplasm of breast: Secondary | ICD-10-CM | POA: Diagnosis not present

## 2024-05-29 DIAGNOSIS — Z0130 Encounter for examination of blood pressure without abnormal findings: Secondary | ICD-10-CM

## 2024-05-29 DIAGNOSIS — E66811 Obesity, class 1: Secondary | ICD-10-CM | POA: Diagnosis not present

## 2024-05-29 DIAGNOSIS — E1165 Type 2 diabetes mellitus with hyperglycemia: Secondary | ICD-10-CM

## 2024-05-29 DIAGNOSIS — R5383 Other fatigue: Secondary | ICD-10-CM | POA: Insufficient documentation

## 2024-05-29 LAB — POC CREATINE & ALBUMIN,URINE
Creatinine, POC: 200 mg/dL
Microalbumin Ur, POC: 150 mg/L

## 2024-05-29 MED ORDER — MOUNJARO 7.5 MG/0.5ML ~~LOC~~ SOAJ: 7.5000 mg | SUBCUTANEOUS | 1 refills | Status: DC

## 2024-05-29 MED ORDER — OMEPRAZOLE MAGNESIUM 20 MG PO TBEC: 20.0000 mg | DELAYED_RELEASE_TABLET | Freq: Every day | ORAL | 11 refills | Status: AC

## 2024-05-29 NOTE — Assessment & Plan Note (Signed)
-   Continue healthy diet and exercise as tolerated. - Continue medications as prescribed. - Well controlled on current treatment plan - Check labs today

## 2024-05-29 NOTE — Assessment & Plan Note (Signed)
-   Check labs today - Supplementation recommended based off lab results and will notify patient at that time

## 2024-05-29 NOTE — Progress Notes (Signed)
 Established Patient Office Visit  Subjective:  Patient ID: Natalie Cuevas, female    DOB: Nov 14, 1955  Age: 68 y.o. MRN: [id number]  Chief Complaint  Patient presents with   Follow-up    3 month follow up, need uACR (HEDIS: Pt needs BCS & COL)      Patient is here today for her 3 months follow up.  She has been feeling fairly well since last appointment.   She does have additional concerns to discuss today. Patient has complaints of heartburn after meals and reports taking tums multiple times a day for the last few months. Discussed lifestyle diet changes and foods that can trigger reflux making it worse. Will start Prilosec 20 mg daily. Labs are due today including UAC She needs refills.  I have reviewed her active problem list, medication list, allergies, family history, social history, health maintenance, notes from last encounter, lab results for her appointment today.    Will order Mammogram and Cologuard that are due to be completed.    No other concerns at this time.   Past Medical History:  Diagnosis Date   Hypertension    Tooth abscess 12/01/2014    Past Surgical History:  Procedure Laterality Date   ABDOMINAL HYSTERECTOMY     APPENDECTOMY     CHOLECYSTECTOMY     TONSILLECTOMY      Social History   Socioeconomic History   Marital status: Single    Spouse name: Not on file   Number of children: Not on file   Years of education: Not on file   Highest education level: Not on file  Occupational History   Not on file  Tobacco Use   Smoking status: Former    Current packs/day: 0.25    Types: Cigarettes   Smokeless tobacco: Never  Vaping Use   Vaping status: Never Used  Substance and Sexual Activity   Alcohol use: No   Drug use: No    Types: Crack cocaine   Sexual activity: Not Currently  Other Topics Concern   Not on file  Social History Narrative   Not on file   Social Drivers of Health   Financial Resource Strain: Low Risk   (06/17/2023)   Overall Financial Resource Strain (CARDIA)    Difficulty of Paying Living Expenses: Not hard at all  Food Insecurity: No Food Insecurity (06/07/2023)   Hunger Vital Sign    Worried About Running Out of Food in the Last Year: Never true    Ran Out of Food in the Last Year: Never true  Transportation Needs: No Transportation Needs (06/07/2023)   PRAPARE - Administrator, Civil Service (Medical): No    Lack of Transportation (Non-Medical): No  Physical Activity: Insufficiently Active (06/07/2023)   Exercise Vital Sign    Days of Exercise per Week: 4 days    Minutes of Exercise per Session: 30 min  Stress: No Stress Concern Present (06/07/2023)   [employer] of Occupational Health - Occupational Stress Questionnaire    Feeling of Stress : Not at all  Social Connections: Not on file  Intimate Partner Violence: Not At Risk (06/07/2023)   Humiliation, Afraid, Rape, and Kick questionnaire    Fear of Current or Ex-Partner: No    Emotionally Abused: No    Physically Abused: No    Sexually Abused: No    Family History  Problem Relation Age of Onset   Hypertension Mother    Huntington's disease Father    Breast cancer  Neg Hx     Allergies  Allergen Reactions   Codeine     Review of Systems  Constitutional:  Negative for malaise/fatigue.  HENT: Negative.    Eyes:  Negative for blurred vision and pain.  Respiratory:  Negative for cough and shortness of breath.   Cardiovascular:  Negative for chest pain, palpitations, claudication and leg swelling.  Gastrointestinal:  Positive for heartburn. Negative for abdominal pain, blood in stool, constipation, diarrhea, nausea and vomiting.  Genitourinary:  Negative for dysuria, frequency and urgency.  Musculoskeletal: Negative.   Skin: Negative.   Neurological:  Negative for dizziness, tingling, sensory change and headaches.  Endo/Heme/Allergies: Negative.   Psychiatric/Behavioral: Negative.          Objective:   BP 130/82   Pulse 78   Ht 5' 5 (1.651 m)   Wt 202 lb 3.2 oz (91.7 kg)   SpO2 98%   BMI 33.65 kg/m   Vitals:   05/29/24 0856  BP: 130/82  Pulse: 78  Height: 5' 5 (1.651 m)  Weight: 202 lb 3.2 oz (91.7 kg)  SpO2: 98%  BMI (Calculated): 33.65    Physical Exam Vitals and nursing note reviewed.  Constitutional:      Appearance: Normal appearance.  HENT:     Head: Normocephalic.  Eyes:     Extraocular Movements: Extraocular movements intact.     Pupils: Pupils are equal, round, and reactive to light.  Cardiovascular:     Rate and Rhythm: Normal rate and regular rhythm.     Pulses: Normal pulses.     Heart sounds: Normal heart sounds. No murmur heard. Pulmonary:     Effort: Pulmonary effort is normal. No respiratory distress.     Breath sounds: Normal breath sounds.  Abdominal:     General: There is no distension.     Tenderness: There is no abdominal tenderness.  Musculoskeletal:        General: No tenderness. Normal range of motion.     Cervical back: Normal range of motion and neck supple.     Right lower leg: No edema.     Left lower leg: No edema.  Feet:     Left foot:     Toenail Condition: Left toenails are abnormally thick. Fungal disease present. Skin:    General: Skin is warm and dry.     Coloration: Skin is not jaundiced.     Findings: No erythema.  Neurological:     General: No focal deficit present.     Mental Status: She is alert and oriented to person, place, and time.  Psychiatric:        Mood and Affect: Mood normal.        Speech: Speech normal.        Behavior: Behavior is cooperative.        Cognition and Memory: Memory is not impaired.      No results found for any visits on 05/29/24.  Recent Results (from the past 2160 hours)  Urinalysis, Routine w reflex microscopic     Status: Abnormal   Collection Time: 03/06/24  9:05 AM  Result Value Ref Range   Specific Gravity, UA 1.014 1.005 - 1.030   pH, UA 6.5 5.0 - 7.5    Color, UA Yellow Yellow   Appearance Ur Turbid (A) Clear   Leukocytes,UA 1+ (A) Negative   Protein,UA Trace Negative/Trace   Glucose, UA Negative Negative   Ketones, UA Negative Negative   RBC, UA Negative Negative  Bilirubin, UA Negative Negative   Urobilinogen, Ur 0.2 0.2 - 1.0 mg/dL   Nitrite, UA Negative Negative   Microscopic Examination See below:     Comment: Microscopic was indicated and was performed.  Microscopic Examination     Status: Abnormal   Collection Time: 03/06/24  9:05 AM  Result Value Ref Range   WBC, UA 11-30 (A) 0 - 5 /hpf   RBC, Urine None seen 0 - 2 /hpf   Epithelial Cells (non renal) >10 (A) 0 - 10 /hpf   Casts None seen None seen /lpf   Crystals Present (A) N/A   Crystal Type Calcium Oxalate N/A   Bacteria, UA Many (A) None seen/Few  POCT Urinalysis Dipstick (18997)     Status: Abnormal   Collection Time: 03/11/24  9:18 AM  Result Value Ref Range   Color, UA Yellow    Clarity, UA Cloudy    Glucose, UA Negative Negative   Bilirubin, UA Negative    Ketones, UA Negative    Spec Grav, UA 1.010 1.010 - 1.025   Blood, UA Negative    pH, UA 6.5 5.0 - 8.0   Protein, UA Negative Negative   Urobilinogen, UA 0.2 0.2 or 1.0 E.U./dL   Nitrite, UA Negative    Leukocytes, UA Trace (A) Negative   Appearance Cloudy    Odor Yes   Urinalysis, Routine w reflex microscopic     Status: Abnormal   Collection Time: 03/11/24 11:58 AM  Result Value Ref Range   Specific Gravity, UA 1.006 1.005 - 1.030   pH, UA 6.5 5.0 - 7.5   Color, UA Yellow Yellow   Appearance Ur Clear Clear   Leukocytes,UA Trace (A) Negative   Protein,UA Negative Negative/Trace   Glucose, UA Negative Negative   Ketones, UA Negative Negative   RBC, UA Negative Negative   Bilirubin, UA Negative Negative   Urobilinogen, Ur 0.2 0.2 - 1.0 mg/dL   Nitrite, UA Negative Negative   Microscopic Examination See below:     Comment: Microscopic was indicated and was performed.  Microscopic  Examination     Status: None   Collection Time: 03/11/24 11:58 AM  Result Value Ref Range   WBC, UA 0-5 0 - 5 /hpf   RBC, Urine None seen 0 - 2 /hpf   Epithelial Cells (non renal) 0-10 0 - 10 /hpf   Casts None seen None seen /lpf   Bacteria, UA Few None seen/Few  Urine Culture     Status: None   Collection Time: 03/11/24 12:01 PM   Specimen: Urine, Clean Catch   UR  Result Value Ref Range   Urine Culture, Routine Final report    Organism ID, Bacteria Comment     Comment: Mixed urogenital flora 10,000-25,000 colony forming units per mL        Assessment & Plan:   Assessment & Plan Vitamin D  deficiency Other fatigue B12 deficiency due to diet - Check labs today - Supplementation recommended based off lab results and will notify patient at that time  Combined hyperlipidemia associated with type 2 diabetes mellitus (HCC) Type 2 diabetes mellitus with hyperglycemia, without long-term current use of insulin (HCC) Class 1 obesity due to excess calories with serious comorbidity and body mass index (BMI) of 33.0 to 33.9 in adult - Continue healthy diet and exercise as tolerated. - Continue medications as prescribed. - Well controlled on current treatment plan - Check labs today  Gastroesophageal reflux disease without esophagitis - Start Prilosec 20  mg daily. - Avoid dietary triggers Colon cancer screening Breast cancer screening by mammogram - Complete preventative screenings.    Return in about 3 months (around 08/27/2024).   Total time spent: 25 minutes  Oddis DELENA Cain, FNP  05/29/2024   This document may have been prepared by [facility] Voice Recognition software and as such may include unintentional dictation errors.

## 2024-05-29 NOTE — Assessment & Plan Note (Signed)
-   Complete preventative screenings.

## 2024-05-29 NOTE — Patient Instructions (Addendum)
 Tea tree oil treatments to toenail fungus 3 times a day for 10-15 minutes at a time.  Reinforce healthy diet and exercise as tolerated.  Start Prilosec each morning before breakfast. Avoid dietary triggers  Call and get mammogram scheduled [facility]  [address], [address] [facility] [place],  [place]  [address]  Main: [phone]   Complete Cologuard

## 2024-05-29 NOTE — Assessment & Plan Note (Signed)
-   Start Prilosec 20 mg daily. - Avoid dietary triggers

## 2024-05-30 LAB — VITAMIN B12: Vitamin B-12: 406 pg/mL (ref 232–1245)

## 2024-05-30 LAB — CMP14+EGFR
ALT: 16 IU/L (ref 0–32)
AST: 26 IU/L (ref 0–40)
Albumin: 4.1 g/dL (ref 3.9–4.9)
Alkaline Phosphatase: 77 IU/L (ref 49–135)
BUN/Creatinine Ratio: 11 — ABNORMAL LOW (ref 12–28)
BUN: 11 mg/dL (ref 8–27)
Bilirubin Total: 0.7 mg/dL (ref 0.0–1.2)
CO2: 24 mmol/L (ref 20–29)
Calcium: 10 mg/dL (ref 8.7–10.3)
Chloride: 101 mmol/L (ref 96–106)
Creatinine, Ser: 1.04 mg/dL — ABNORMAL HIGH (ref 0.57–1.00)
Globulin, Total: 3.2 g/dL (ref 1.5–4.5)
Glucose: 76 mg/dL (ref 70–99)
Potassium: 4.4 mmol/L (ref 3.5–5.2)
Sodium: 139 mmol/L (ref 134–144)
Total Protein: 7.3 g/dL (ref 6.0–8.5)
eGFR: 58 mL/min/1.73 — ABNORMAL LOW (ref >59)

## 2024-05-30 LAB — CBC WITH DIFFERENTIAL/PLATELET
Basophils Absolute: 0 x10E3/uL (ref 0.0–0.2)
Basos: 0 %
EOS (ABSOLUTE): 0.2 x10E3/uL (ref 0.0–0.4)
Eos: 3 %
Hematocrit: 43.1 % (ref 34.0–46.6)
Hemoglobin: 13.6 g/dL (ref 11.1–15.9)
Immature Grans (Abs): 0 x10E3/uL (ref 0.0–0.1)
Immature Granulocytes: 0 %
Lymphocytes Absolute: 1.9 x10E3/uL (ref 0.7–3.1)
Lymphs: 26 %
MCH: 28 pg (ref 26.6–33.0)
MCHC: 31.6 g/dL (ref 31.5–35.7)
MCV: 89 fL (ref 79–97)
Monocytes Absolute: 0.5 x10E3/uL (ref 0.1–0.9)
Monocytes: 7 %
Neutrophils Absolute: 4.6 x10E3/uL (ref 1.4–7.0)
Neutrophils: 64 %
Platelets: 226 x10E3/uL (ref 150–450)
RBC: 4.86 x10E6/uL (ref 3.77–5.28)
RDW: 13.4 % (ref 11.7–15.4)
WBC: 7.2 x10E3/uL (ref 3.4–10.8)

## 2024-05-30 LAB — LIPID PANEL
Chol/HDL Ratio: 2.7 ratio (ref 0.0–4.4)
Cholesterol, Total: 142 mg/dL (ref 100–199)
HDL: 53 mg/dL (ref >39)
LDL Chol Calc (NIH): 70 mg/dL (ref 0–99)
Triglycerides: 106 mg/dL (ref 0–149)
VLDL Cholesterol Cal: 19 mg/dL (ref 5–40)

## 2024-05-30 LAB — HEMOGLOBIN A1C
Est. average glucose Bld gHb Est-mCnc: 100 mg/dL
Hgb A1c MFr Bld: 5.1 % (ref 4.8–5.6)

## 2024-05-30 LAB — VITAMIN D 25 HYDROXY (VIT D DEFICIENCY, FRACTURES): Vit D, 25-Hydroxy: 79.3 ng/mL (ref 30.0–100.0)

## 2024-05-30 LAB — TSH: TSH: 3.21 u[IU]/mL (ref 0.450–4.500)

## 2024-06-04 ENCOUNTER — Other Ambulatory Visit: Payer: Self-pay

## 2024-06-04 ENCOUNTER — Ambulatory Visit: Payer: Self-pay

## 2024-06-04 MED ORDER — KERENDIA 20 MG PO TABS: 20.0000 mg | ORAL_TABLET | Freq: Every day | ORAL | 0 refills | Status: DC

## 2024-06-22 ENCOUNTER — Other Ambulatory Visit: Payer: Self-pay | Admitting: Family

## 2024-06-26 ENCOUNTER — Telehealth: Payer: Self-pay | Admitting: Family

## 2024-06-26 NOTE — Telephone Encounter (Signed)
 Patient left VM stating the pharmacy only filled her Kerendia  for 30 days so she wanted to know if she is supposed to come back in 30 days or if were going to send in more refills? Please advise.

## 2024-07-01 ENCOUNTER — Other Ambulatory Visit: Payer: Self-pay

## 2024-07-01 MED ORDER — KERENDIA 20 MG PO TABS: 20.0000 mg | ORAL_TABLET | Freq: Every day | ORAL | 3 refills | Status: DC

## 2024-07-15 NOTE — Progress Notes (Signed)
 Natalie Cuevas                                          MRN: [id number]   07/15/2024   The [facility] Quality Team Specialist reviewed this patient medical record for the purposes of chart review for care gap closure. The following were reviewed: abstraction for care gap closure-glycemic status assessment.    [facility] Quality Team

## 2024-07-18 ENCOUNTER — Other Ambulatory Visit: Payer: Self-pay | Admitting: Family

## 2024-07-18 DIAGNOSIS — E1165 Type 2 diabetes mellitus with hyperglycemia: Secondary | ICD-10-CM

## 2024-07-19 ENCOUNTER — Other Ambulatory Visit: Payer: Self-pay | Admitting: Family

## 2024-07-19 ENCOUNTER — Encounter: Payer: Self-pay | Admitting: *Deleted

## 2024-07-19 NOTE — Progress Notes (Signed)
 CATHARINA PICA                                          MRN: [id number]   07/19/2024   The [facility] Quality Team Specialist reviewed this patient medical record for the purposes of chart review for care gap closure. The following were reviewed: chart review for care gap closure-glycemic status assessment.    [facility] Quality Team

## 2024-08-27 ENCOUNTER — Ambulatory Visit: Admitting: Family
